# Patient Record
Sex: Female | Born: 1950 | Race: White | Hispanic: No | Smoking: Former smoker
Health system: Southern US, Community
[De-identification: ages and names within clinical notes are randomized; demographics above are authoritative.]

## PROBLEM LIST (undated history)

## (undated) DIAGNOSIS — J45909 Unspecified asthma, uncomplicated: Secondary | ICD-10-CM

## (undated) DIAGNOSIS — I82409 Acute embolism and thrombosis of unspecified deep veins of unspecified lower extremity: Secondary | ICD-10-CM

## (undated) DIAGNOSIS — G4730 Sleep apnea, unspecified: Secondary | ICD-10-CM

## (undated) DIAGNOSIS — I10 Essential (primary) hypertension: Secondary | ICD-10-CM

## (undated) DIAGNOSIS — E119 Type 2 diabetes mellitus without complications: Secondary | ICD-10-CM

## (undated) DIAGNOSIS — K219 Gastro-esophageal reflux disease without esophagitis: Secondary | ICD-10-CM

## (undated) DIAGNOSIS — D126 Benign neoplasm of colon, unspecified: Secondary | ICD-10-CM

## (undated) DIAGNOSIS — I341 Nonrheumatic mitral (valve) prolapse: Secondary | ICD-10-CM

## (undated) DIAGNOSIS — M1990 Unspecified osteoarthritis, unspecified site: Secondary | ICD-10-CM

## (undated) DIAGNOSIS — I739 Peripheral vascular disease, unspecified: Secondary | ICD-10-CM

## (undated) DIAGNOSIS — M5020 Other cervical disc displacement, unspecified cervical region: Secondary | ICD-10-CM

## (undated) DIAGNOSIS — R233 Spontaneous ecchymoses: Secondary | ICD-10-CM

## (undated) DIAGNOSIS — R238 Other skin changes: Secondary | ICD-10-CM

## (undated) HISTORY — PX: HERNIA REPAIR: SHX51

## (undated) HISTORY — DX: Essential (primary) hypertension: I10

## (undated) HISTORY — PX: CHOLECYSTECTOMY: SHX55

## (undated) HISTORY — DX: Type 2 diabetes mellitus without complications: E11.9

## (undated) HISTORY — PX: CERVICAL SPINE SURGERY: SHX589

## (undated) HISTORY — DX: Unspecified asthma, uncomplicated: J45.909

## (undated) HISTORY — PX: APPENDECTOMY: SHX54

## (undated) HISTORY — PX: TONSILLECTOMY: SUR1361

---

## 2004-09-30 ENCOUNTER — Ambulatory Visit: Payer: Self-pay | Admitting: Internal Medicine

## 2005-02-06 ENCOUNTER — Ambulatory Visit: Payer: Self-pay | Admitting: Internal Medicine

## 2005-09-06 HISTORY — PX: ABDOMINAL HYSTERECTOMY: SHX81

## 2006-01-03 ENCOUNTER — Ambulatory Visit: Payer: Self-pay | Admitting: Internal Medicine

## 2006-01-05 ENCOUNTER — Inpatient Hospital Stay (HOSPITAL_COMMUNITY): Admission: AD | Admit: 2006-01-05 | Discharge: 2006-01-05 | Payer: Self-pay | Admitting: Gynecology

## 2006-01-05 ENCOUNTER — Ambulatory Visit: Payer: Self-pay | Admitting: Gynecology

## 2006-01-13 ENCOUNTER — Encounter (INDEPENDENT_AMBULATORY_CARE_PROVIDER_SITE_OTHER): Payer: Self-pay | Admitting: Specialist

## 2006-01-13 ENCOUNTER — Ambulatory Visit (HOSPITAL_COMMUNITY): Admission: RE | Admit: 2006-01-13 | Discharge: 2006-01-14 | Payer: Self-pay | Admitting: Gynecology

## 2006-01-13 ENCOUNTER — Ambulatory Visit: Payer: Self-pay | Admitting: Gynecology

## 2006-01-27 ENCOUNTER — Ambulatory Visit: Payer: Self-pay | Admitting: Internal Medicine

## 2006-02-07 ENCOUNTER — Ambulatory Visit: Payer: Self-pay | Admitting: Gynecology

## 2006-03-17 ENCOUNTER — Other Ambulatory Visit: Payer: Self-pay

## 2006-03-17 ENCOUNTER — Emergency Department: Payer: Self-pay | Admitting: Emergency Medicine

## 2007-01-31 ENCOUNTER — Ambulatory Visit: Payer: Self-pay | Admitting: Internal Medicine

## 2007-12-08 ENCOUNTER — Ambulatory Visit: Payer: Self-pay | Admitting: Specialist

## 2007-12-25 ENCOUNTER — Ambulatory Visit: Payer: Self-pay | Admitting: Gastroenterology

## 2007-12-29 ENCOUNTER — Emergency Department: Payer: Self-pay | Admitting: Emergency Medicine

## 2008-01-02 ENCOUNTER — Emergency Department: Payer: Self-pay | Admitting: Emergency Medicine

## 2008-02-06 ENCOUNTER — Ambulatory Visit: Payer: Self-pay | Admitting: Internal Medicine

## 2008-09-06 HISTORY — PX: SPINE SURGERY: SHX786

## 2008-09-13 ENCOUNTER — Ambulatory Visit: Payer: Self-pay | Admitting: Orthopedic Surgery

## 2008-10-10 ENCOUNTER — Ambulatory Visit: Payer: Self-pay | Admitting: Internal Medicine

## 2009-01-20 ENCOUNTER — Ambulatory Visit: Payer: Self-pay | Admitting: Otolaryngology

## 2009-02-10 ENCOUNTER — Ambulatory Visit: Payer: Self-pay | Admitting: Internal Medicine

## 2009-03-12 ENCOUNTER — Ambulatory Visit (HOSPITAL_COMMUNITY): Admission: RE | Admit: 2009-03-12 | Discharge: 2009-03-13 | Payer: Self-pay | Admitting: Neurosurgery

## 2009-07-25 ENCOUNTER — Emergency Department: Payer: Self-pay | Admitting: Emergency Medicine

## 2009-09-06 HISTORY — PX: COLOSTOMY: SHX63

## 2009-12-27 ENCOUNTER — Encounter: Admission: RE | Admit: 2009-12-27 | Discharge: 2009-12-27 | Payer: Self-pay | Admitting: Neurosurgery

## 2010-01-07 ENCOUNTER — Ambulatory Visit: Payer: Self-pay | Admitting: Otolaryngology

## 2010-02-11 ENCOUNTER — Ambulatory Visit: Payer: Self-pay | Admitting: Internal Medicine

## 2010-03-02 ENCOUNTER — Ambulatory Visit: Payer: Self-pay | Admitting: Internal Medicine

## 2010-03-11 ENCOUNTER — Inpatient Hospital Stay: Payer: Self-pay | Admitting: Specialist

## 2010-03-27 ENCOUNTER — Inpatient Hospital Stay: Payer: Self-pay | Admitting: Surgery

## 2010-04-19 ENCOUNTER — Emergency Department: Payer: Self-pay | Admitting: Emergency Medicine

## 2010-04-28 ENCOUNTER — Ambulatory Visit: Payer: Self-pay | Admitting: Gastroenterology

## 2010-04-30 LAB — PATHOLOGY REPORT

## 2010-05-04 ENCOUNTER — Emergency Department: Payer: Self-pay | Admitting: Emergency Medicine

## 2010-05-12 ENCOUNTER — Emergency Department: Payer: Self-pay | Admitting: Emergency Medicine

## 2010-05-18 ENCOUNTER — Ambulatory Visit: Payer: Self-pay | Admitting: Surgery

## 2010-06-23 ENCOUNTER — Ambulatory Visit: Payer: Self-pay | Admitting: Surgery

## 2010-06-30 ENCOUNTER — Inpatient Hospital Stay: Payer: Self-pay | Admitting: Surgery

## 2010-06-30 HISTORY — PX: COLON SURGERY: SHX602

## 2010-07-02 LAB — PATHOLOGY REPORT

## 2010-07-15 ENCOUNTER — Ambulatory Visit: Payer: Self-pay | Admitting: Surgery

## 2010-07-21 ENCOUNTER — Inpatient Hospital Stay: Payer: Self-pay | Admitting: Surgery

## 2010-08-13 ENCOUNTER — Ambulatory Visit: Payer: Self-pay | Admitting: Surgery

## 2010-09-28 ENCOUNTER — Encounter: Payer: Self-pay | Admitting: Neurosurgery

## 2010-11-13 ENCOUNTER — Ambulatory Visit: Payer: Self-pay | Admitting: Surgery

## 2010-12-13 LAB — BASIC METABOLIC PANEL
BUN: 14 mg/dL (ref 6–23)
CO2: 30 mEq/L (ref 19–32)
Calcium: 9.4 mg/dL (ref 8.4–10.5)
Chloride: 104 mEq/L (ref 96–112)
Creatinine, Ser: 1.11 mg/dL (ref 0.4–1.2)
GFR calc Af Amer: >60 mL/min (ref >60)
GFR calc non Af Amer: 50 mL/min — ABNORMAL LOW (ref >60)
Glucose, Bld: 106 mg/dL — ABNORMAL HIGH (ref 70–99)
Potassium: 4.6 mEq/L (ref 3.5–5.1)
Sodium: 140 mEq/L (ref 135–145)

## 2010-12-13 LAB — CBC
HCT: 42.7 % (ref 36.0–46.0)
Hemoglobin: 14.7 g/dL (ref 12.0–15.0)
MCHC: 34.5 g/dL (ref 30.0–36.0)
MCV: 94.6 fL (ref 78.0–100.0)
Platelets: 219 10*3/uL (ref 150–400)
RBC: 4.51 MIL/uL (ref 3.87–5.11)
RDW: 12.9 % (ref 11.5–15.5)
WBC: 6 10*3/uL (ref 4.0–10.5)

## 2011-01-19 NOTE — Op Note (Signed)
NAMEAMAIYA, Kelly Perry    ACCOUNT NO.:  [id number]   MEDICAL RECORD NO.:  [id number]          PATIENT TYPE:  AMB   LOCATION:  SDS                          FACILITY:  [facility]   PHYSICIAN:  Hewitt Shorts, M.D.DATE OF BIRTH:  1951-03-18   DATE OF PROCEDURE:  03/12/2009  DATE OF DISCHARGE:                               OPERATIVE REPORT   PREOPERATIVE DIAGNOSES:  1. C5-6 cervical disk herniation.  2. Cervical degenerative disk disease.  3. Cervical spondylosis.  4. Neck pain.   POSTOPERATIVE DIAGNOSES:  1. C5-6 cervical disk herniation.  2. Cervical degenerative disk disease.  3. Cervical spondylosis.  4. Neck pain.   PROCEDURE:  C5-6 anterior cervical decompression arthrodesis with  allograft and Tether cervical plating.   SURGEON:  Hewitt Shorts, M.D.   ASSISTANT:  Clydene Fake, M.D.   ANESTHESIA:  General endotracheal.   INDICATIONS:  The patient is a 60 year old woman who presented with neck  pain, extending into the shoulders and upper extremities.  MRI scan  revealed significant degenerative disk at C5-6 broad-based disk  protrusion.  Decision was made to proceed with a single level  decompression and arthrodesis.   PROCEDURE:  The patient was brought to the operating room and placed  under general endotracheal anesthesia.  The patient was placed in 10  pounds of Holter traction.  The neck was prepped with Betadine soap and  solution, draped in a sterile fashion.  A horizontal incision was made  on the left side of the neck.  The line of the incision was infiltrated  with local anesthetic with epinephrine.  Dissection was carried down  through the subcutaneous tissue and platysma.  Bipolar cautery was used  to maintain hemostasis.  Dissection was then carried through an  avascular plane leaving the sternocleidomastoid, carotid artery, and  jugular vein laterally, and the trachea and esophagus medially.  The  ventral aspect of the vertebral column  was identified and localizing x-  ray was taken and the C5-6 intervertebral disk space was identified.  Diskectomy was begun with an incision of the annulus and continued with  microcurettes and pituitary rongeurs.  The operating microscope was  draped and brought into the field to provide additional navigation,  illumination, and visualization.  The remainder of the decompression  performed using microdissection and microsurgical technique.  Anterior  osteophytic overgrowth was removed using the osteophyte removal tool and  the cartilaginous endplates were removed using microcurettes along with  the high-speed drill.  Posterior osteophytic overgrowth was similarly  removed with the high-speed drill along with a 2-mm Kerrison punch with  a thin footplate.  Posterior longitudinal ligament was carefully opened  and ligamented disk herniation were removed.  We were able to decompress  the spinal canal and thecal sac, and examined the neuroforamen, and  removed the spondylitic disk protrusion laterally and neuroforamen were  decompressed.   Once the decompression was completed, hemostasis was established with  the use of Gelfoam soaked in thrombin, and then Gelfoam was removed.  Hemostasis was confirmed.  We measured the height of the intervertebral  disk space and selected an 8-mm allograft implant.  The allograft  implant was hydrated in saline solution and then carefully positioned in  the intervertebral disk space and countersunk.  We then selected a 14-mm  tethered cervical plate, it was positioned over the fusion construct and  secured to the vertebra with 4 x 14 mm variable angle screws placing a  pair of screws at C5 and another pair at C6.  Each screw hole was  drilled and the screws placed in alternating fashion.  Once all 4 screws  were placed, the final tightening was performed.  An x-ray was taken  which showed the graft, plate, and screws in good position.  The  alignment was  good.  The wound was irrigated with Bacitracin solution.  Hemostasis was established and confirmed, and then we proceeded with  closure.  Platysma was closed with interrupted inverted 2-0 undyed  Vicryl sutures.  The subcutaneous and subcuticular layer closed with  interrupted inverted 3-0 undyed Vicryl sutures.  The skin was  reapproximated with Dermabond.  The procedure was tolerated well.  Estimated blood loss was 50 mL.  Sponge and needle count were correct.  Following surgery, the patient was taken out of the cervical traction,  was placed in a soft cervical collar, reversed from the anesthetic,  extubated, to be transferred to the recovery room for further care.      Hewitt Shorts, M.D.  Electronically Signed     RWN/MEDQ  D:  03/12/2009  T:  03/13/2009  Job:  [id number]

## 2011-01-22 NOTE — Op Note (Signed)
Kelly Perry, Kelly Perry            ACCOUNT NO.:  [id number]   MEDICAL RECORD NO.:  [id number]           PATIENT TYPE:   LOCATION:                                 FACILITY:   PHYSICIAN:  Ginger Carne, MD       DATE OF BIRTH:   DATE OF PROCEDURE:  01/13/2006  DATE OF DISCHARGE:                                 OPERATIVE REPORT   PREOPERATIVE DIAGNOSIS:  Bilateral ovarian masses.   POSTOP DIAGNOSIS:  1.  Bilateral ovarian masses.  2.  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   PROCEDURES:  1.  Laparoscopic-assisted vaginal hysterectomy.  2.  Bilateral salpingo-oophorectomy.  3.  Appendectomy.  4.  Peritoneal washings.   SURGEON:  Ginger Carne, MD   ASSISTANT:  None.   COMPLICATIONS:  None immediate.   ESTIMATED BLOOD LOSS:  75 mL.   SPECIMEN:  Uterus, cervix, right left tubes and ovaries, appendix, and  peritoneal washings.   ANESTHESIA:  General.   OPERATIVE FINDINGS:  External genitalia, vulva, vagina normal.  Cervix  smooth without erosions or lesions.  The laparoscopic evaluation revealed  bilateral ovarian masses that appeared cystic, 6 cm on the left side and 5  on the right.  The left was adherent to the anterior abdominal wall adjacent  to the bladder.  The ovaries demonstrated no evidence for excrescences.  Tubes appeared normal.  No evidence of peritoneal implants or adhesive  disease.  Large and small bowel grossly normal.  Appendix appeared normal.  The uterus appeared to have a normal contour and was normal size.   OPERATIVE PROCEDURE:  The patient prepped and draped in the usual fashion  and placed in the lithotomy position.  Betadine solution used for antiseptic  and the patient was catheterized prior to procedure.  After adequate general  anesthesia, tenaculum was placed on the anterior lip of the cervix and the  [facility] uterine manipulator placed in the same.  Afterwards a vertical  infraumbilical incision was made; and the Veress needle  placed in the  abdomen.  Opening closing pressures were 10-15 mmHg. Verres released and the  trocar placed in the same incision and the laparoscope placed in the trocar  sleeve.  Two 5-mm ports were made under direct visualization in the left  lower quadrant and left hypogastric regions respectively.  After inspecting  the pelvis and upper abdomen and evaluating the appendix, liver, and all  other abdominal structures, which appeared to be normal; the mesoappendix  was identified, bipolar cauterized; and cut to the base; #0 Vicryl sutures  and a loop tie were placed at the base x2, one about 8 mm above the first  two.  The appendix was cut above the first 2 sutures and removed with an  Endopouch bag.  Copious irrigation with lactated Ringer's followed for the  base.  No active bleeding noted.  Following this the hysterectomy proper was  performed.   The left ovarian adhesion to the parietal peritoneum on the left anterior  abdominal wall, immediately above the bladder, was identified; adhesions  were cut carefully which allowed the ovary to  drop into the pelvis.  The  ureters identified bilaterally.  Both infundibulopelvic ligaments were  skeletonized, bipolar cauterized, and cut to the round ligaments which were  similarly bipolar cauterized and cut.  No active bleeding noted.   At this point attention was directed to the vaginal portion of the  procedure.  After repositioning and adequate draping, a double-tooth  tenaculum placed on the anterior and posterior lips of the cervix.  Marcaine  with epinephrine solution was used as a paracervical block.  Anterior and  posterior vaginal epithelium was incised transversely by cautery.  The  peritoneal reflection similarly opened without injury to the respective  organs.  Uterosacral-cardinal ligament complexes were clamped, cut, and  ligated with #0 Vicryl suture and affixed to their respective vaginal walls.  The uterine vasculature was  clamped, cut, and ligated with #0 Vicryl suture;  ascending branches and broad ligaments similarly clamped, cut, and ligated  with #0 Vicryl suture.  At this point, the uterus, cervix, and both tubes  and ovaries were removed.  Bleeding points hemostatically checked.  Blood  clots removed.  Closure of the cuff in one layer was #0 Vicryl, running,  interlocking suture.   The patient was reexamined through laparoscope and bleeding points  hemostatically checked.  Irrigation of lactated Ringer's followed; irrigant  removed.  Gas released, trocars removed.  Closure of the 10-mm fascia site  with #0 Vicryl suture, and 4-0 Vicryl for a subcuticular closure.  Instrument and sponge count were correct.  The patient tolerated the  procedure well; and returned to post anesthesia recovery room in excellent  condition.   Intraoperative frozen section report was obtained revealing bilateral serous  cystadenomas of both ovaries.      Ginger Carne, MD  Electronically Signed     SHB/MEDQ  D:  01/13/2006  T:  01/14/2006  Job:  [id number]

## 2011-01-22 NOTE — H&P (Signed)
Kelly Perry, Kelly Perry            ACCOUNT NO.:  [id number]   MEDICAL RECORD NO.:  [id number]          PATIENT TYPE:  AMB   LOCATION:  SDC                           FACILITY:  [facility]   PHYSICIAN:  Ginger Carne, MD  DATE OF BIRTH:  March 13, 1951   DATE OF ADMISSION:  01/13/2006  DATE OF DISCHARGE:                                HISTORY & PHYSICAL   CHIEF COMPLAINT:  Bilateral ovarian multicystic masses.   HISTORY OF PRESENT ILLNESS:  This patient is a 60 year old gravida 5, para 73-  0-2-3 Caucasian female who developed the onset of bilateral pelvic pain  approximately 1 month ago.  She was seen at her local emergency room, at  which time CT scan with dye was performed.  At that time, she was told that  she had 2 ovarian cysts and was followed up by her primary care physician in  [facility].  An ultrasound scan revealed on both ovaries, simple cystic  structures without preperitoneal fluid.  She was seen by her gynecologist in  [facility] and was scheduled for surgery.  The patient came to this  office for a second opinion and for surgery.  CA125 performed through  [facility] reveals a level of 16.  Repeat ultrasound scan at the [facility] [place] on Jan 05, 2006 demonstrates bilateral ovarian cyst  masses.  These are multicystic with then septae, no intracavitary  excrescences and no evidence of Doppler abnormalities.  Both masses measure  6 cm in total dimension.  There is no free fluid.  Uterus appears to be  normal in contour and endometrial stripe is 4 mm.  The patient is menopausal  since the age of 36.  She has had no postmenopausal bleeding.   OBSTETRICAL/GYNECOLOGICAL HISTORY:  The patient has had 3 full-term vaginal  deliveries and 2 first trimester spontaneous miscarriages.   ALLERGIES:  PENICILLIN, SULFA, ASPIRIN.  None to Latex or iodine or food.  The patient prefers paper tape.  The patient also indicates that LIDOCAINE  had caused her at one time to  stop breathing, but Carbocaine is acceptable  and has had no untoward complications in the past.   CURRENT MEDICATIONS:  1.  Prilosec one twice a day, 20 mg.  2.  Atenolol 25 mg twice a day.  3.  Cozaar one in the morning daily.  4.  Triamterene one daily.  5.  K-Dur two daily.  6.  Singulair 10 mg one at night daily.  7.  Donnatal Extentabs one twice a day as needed for irritable bowel      syndrome.  8.  Calcium and vitamin D.   MEDICAL HISTORY:  1.  The patient has mitral valve prolapse and requires preoperative      antibiotic prophylaxis.  2.  She has hypertension.  3.  GERD.  4.  The patient reports also a history of irritable bowel syndrome.   SOCIAL HISTORY:  The patient is divorced, denies alcohol, drug abuse and  does not smoke.   SOCIAL HISTORY:  No first-degree relatives with breast, colon or uterine  carcinoma.   REVIEW  OF SYSTEMS:  Ten-point comprehensive review of systems is  unremarkable.   PHYSICAL EXAMINATION:  GENERAL:  Pleasant female in no acute distress.  VITAL SIGNS:  Blood pressure is 138/74.  Height and weight per the vital  sign record.  HEENT:  Grossly normal.  BREASTS:  Without masses, discharge, thickenings or tenderness bilaterally.  CHEST:  Clear to percussion and auscultation.  CARDIOVASCULAR:  Without murmurs or enlargement.  Regular rate and rhythm.  EXTREMITIES:  Within normal limits.  LYMPHATICS:  Within normal limits.  NEUROLOGICAL:  Within normal limits.  MUSCULOSKELETAL:  Within normal limits.  VASCULAR:  Within normal limits.  ABDOMEN:  Soft without gross hepatosplenomegaly.  PELVIC:  External genitalia, vulva and vagina normal.  Cervix smooth without  erosions or lesions.  Uterus is normal in size.  Both adnexa reveal  fullness, consistent with ultrasound findings.  The patient's exam is  limited by BMI.  RECTAL:  Hemoccult-negative without masses.   LABORATORY AND ACCESSORY CLINICAL DATA:  Ultrasound per aforementioned   description.   CA125 16.   IMPRESSION:  Bilateral ovarian masses with normal CA125 and pelvic pain.   PLAN:  The patient is postmenopausal and with said masses, is scheduled for  a laparoscopically assisted vaginal hysterectomy, bilateral salpingo-  oophorectomy, appendectomy with possible conversion to an open procedure in  addition to obtaining peritoneal washings.  The patient understands that  there are no assurances that said masses could not be carcinoma or  borderline tumors; in the event that there is a possibility on inspection of  said findings, the patient will be converted to an open procedure.  Ashby Dawes  of said procedure discussed in detail.  Risks including possible injury to  ureter, bowel and bladder, possible conversion to an open procedure,  hemorrhage requiring blood transfusion, infection, pulmonary or  gastrointestinal complications and other unforeseen complications discussed  with said patient.      Ginger Carne, MD  Electronically Signed     SHB/MEDQ  D:  01/11/2006  T:  01/11/2006  Job:  [id number]

## 2011-01-22 NOTE — Discharge Summary (Signed)
Kelly Perry, BROADWELL            ACCOUNT NO.:  [id number]   MEDICAL RECORD NO.:  [id number]          PATIENT TYPE:  OIB   LOCATION:  9307                          FACILITY:  [facility]   PHYSICIAN:  Ginger Carne, MD  DATE OF BIRTH:  1951/08/24   DATE OF ADMISSION:  01/13/2006  DATE OF DISCHARGE:  01/14/2006                                 DISCHARGE SUMMARY   REASON FOR HOSPITALIZATION:  Bilateral ovarian multicystic masses.   IN-HOSPITAL PROCEDURES:  Laparoscopic-assisted vaginal hysterectomy,  bilateral salpingo-oophorectomy, appendectomy and peritoneal washings.   FINAL DIAGNOSIS:  Bilateral serous cystadenoma on frozen section, final  pathology pending.   HOSPITAL COURSE:  This patient is a [name]  who underwent the aforementioned procedure on May10,2007, because of 6 cm  and 5 cm ovarian multicystic masses.  Interoperative frozen section revealed  benign tumors.  Pathology per above and final results pending.  Interoperative course was unremarkable.  Postoperatively, her course was  uneventful.  She was afebrile, voided well.  H&H 36.4 and 12.4 respectively.  Creatinine on discharge was 0.7.  Abdomen was soft.  Scant vaginal flow.  Lungs clear.  Calves without tenderness.  Description of surgery provided to  the patient and her fiance.   Discharge instructions provided to the patient including contacting the  office for temperature elevation above 100.4 degrees Fahrenheit, increasing  abdominal pain, incisional pain or drainage or redness, increased vaginal  bleeding or discharge, urinary tract or gastrointestinal symptomatology,  pulmonary symptomatology and/or any other concerns.  The patient was  provided Percocet 5/325 1-2 every 4-6h. p.r.n. pain, , Premarin 0.65 mg  daily.  She will be seen in the office in 3-4 weeks time for postoperative  visit.      Ginger Carne, MD  Electronically Signed     SHB/MEDQ  D:  01/14/2006  T:   01/14/2006  Job:  [id number]

## 2011-02-05 HISTORY — PX: COLOSTOMY TAKEDOWN: SHX5783

## 2011-02-12 ENCOUNTER — Ambulatory Visit: Payer: Self-pay | Admitting: Surgery

## 2011-02-12 DIAGNOSIS — I10 Essential (primary) hypertension: Secondary | ICD-10-CM

## 2011-02-19 ENCOUNTER — Inpatient Hospital Stay: Payer: Self-pay | Admitting: Surgery

## 2011-03-07 ENCOUNTER — Inpatient Hospital Stay: Payer: Self-pay | Admitting: Internal Medicine

## 2011-03-07 DIAGNOSIS — I739 Peripheral vascular disease, unspecified: Secondary | ICD-10-CM

## 2011-03-07 HISTORY — DX: Peripheral vascular disease, unspecified: I73.9

## 2011-03-16 ENCOUNTER — Other Ambulatory Visit: Payer: Self-pay | Admitting: Surgery

## 2011-03-19 ENCOUNTER — Other Ambulatory Visit: Payer: Self-pay | Admitting: Surgery

## 2011-03-25 ENCOUNTER — Other Ambulatory Visit: Payer: Self-pay | Admitting: Surgery

## 2011-04-01 ENCOUNTER — Other Ambulatory Visit: Payer: Self-pay | Admitting: Surgery

## 2011-04-08 ENCOUNTER — Other Ambulatory Visit: Payer: Self-pay | Admitting: Surgery

## 2011-04-08 ENCOUNTER — Other Ambulatory Visit: Payer: Self-pay

## 2011-04-21 ENCOUNTER — Other Ambulatory Visit: Payer: Self-pay | Admitting: Surgery

## 2011-04-27 ENCOUNTER — Ambulatory Visit: Payer: Self-pay | Admitting: Surgery

## 2011-05-04 ENCOUNTER — Other Ambulatory Visit: Payer: Self-pay | Admitting: Surgery

## 2011-05-12 ENCOUNTER — Other Ambulatory Visit: Payer: Self-pay | Admitting: Surgery

## 2011-05-14 ENCOUNTER — Ambulatory Visit: Payer: Self-pay | Admitting: Anesthesiology

## 2011-05-26 ENCOUNTER — Other Ambulatory Visit: Payer: Self-pay | Admitting: Surgery

## 2011-06-10 ENCOUNTER — Other Ambulatory Visit: Payer: Self-pay | Admitting: Surgery

## 2011-06-24 ENCOUNTER — Other Ambulatory Visit: Payer: Self-pay | Admitting: Surgery

## 2011-06-29 ENCOUNTER — Ambulatory Visit: Payer: Self-pay

## 2011-07-11 IMAGING — US US EXTREM LOW VENOUS BILAT
1 series · 17 of 24 positions shown · non-contrast
Comparison: none

REASON FOR EXAM: leg swelling, + d-dimer - eval for DVT
COMMENTS:

[Series 1: us extrem low venous bilat · 17 of 56 slices shown]
[im 1/56]
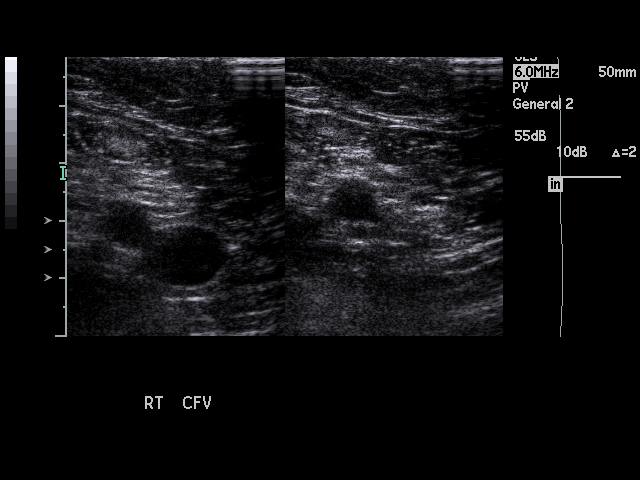
[im 5/56]
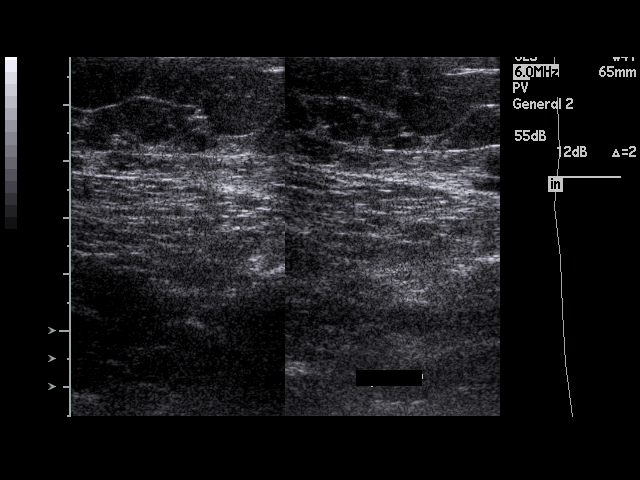
[im 8/56]
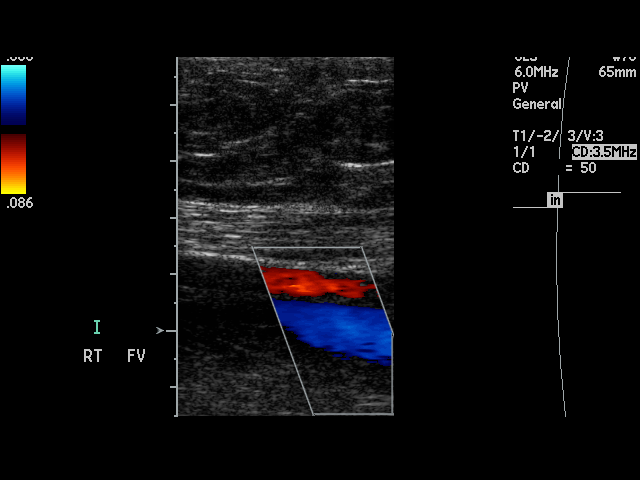
[im 10/56]
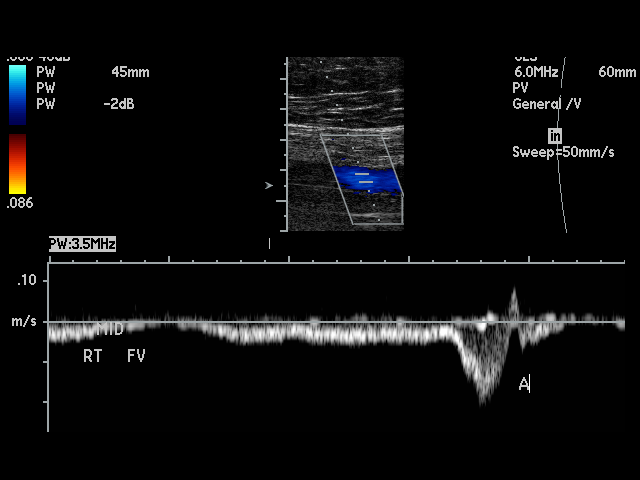
[im 15/56]
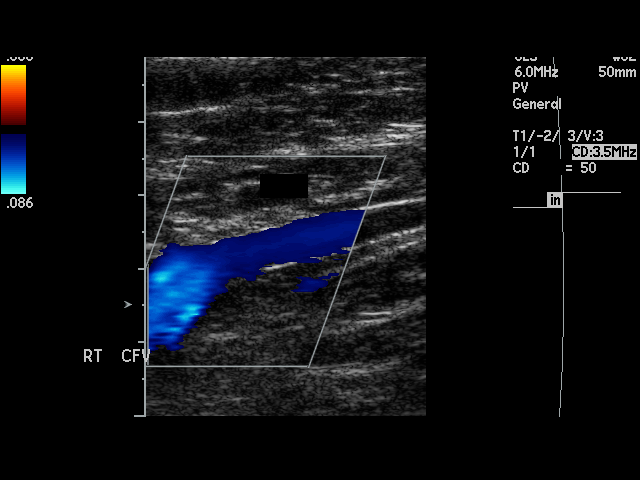
[im 17/56]
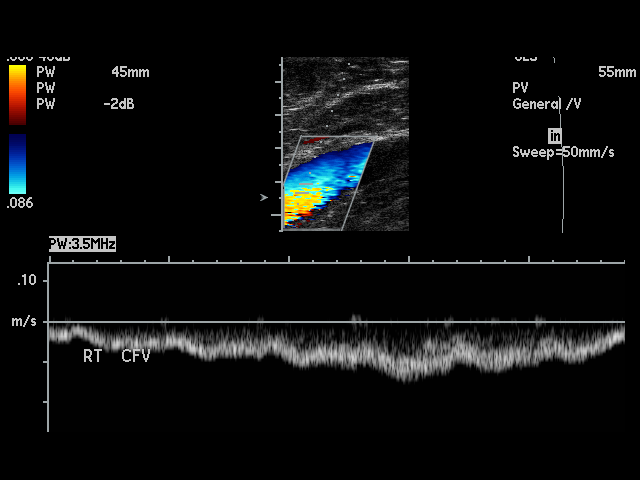
[im 22/56]
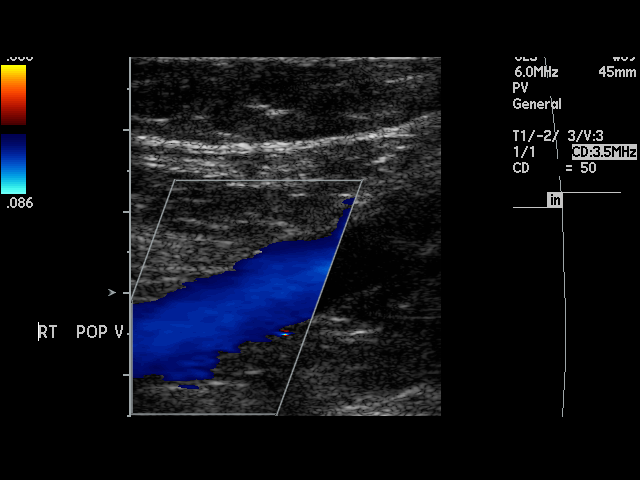
[im 24/56]
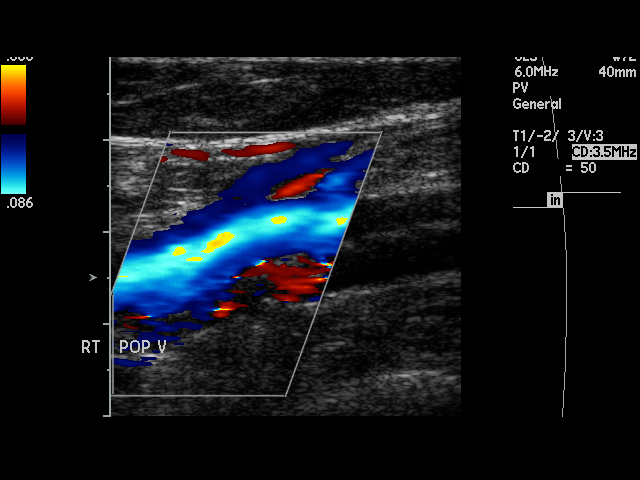
[im 29/56]
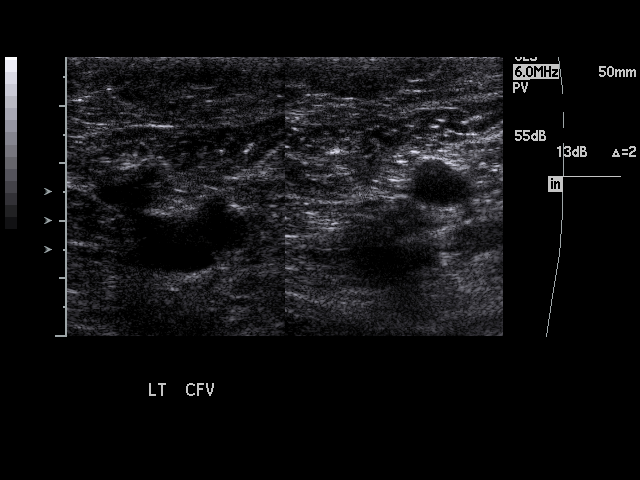
[im 32/56]
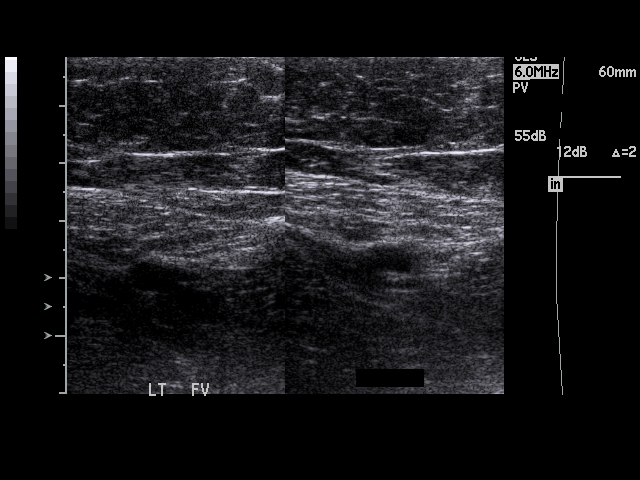
[im 34/56]
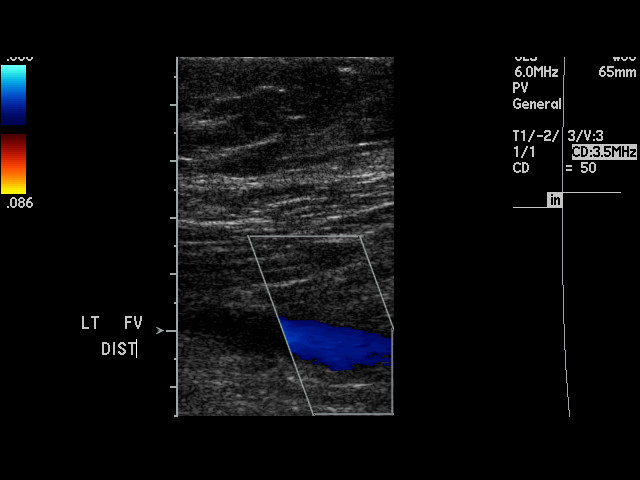
[im 39/56]
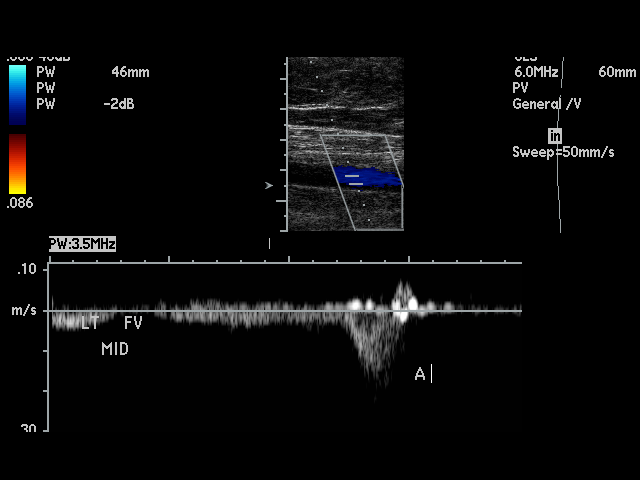
[im 41/56]
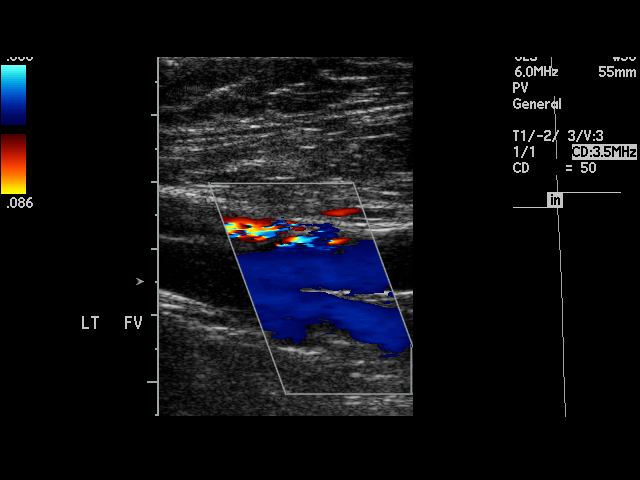
[im 46/56]
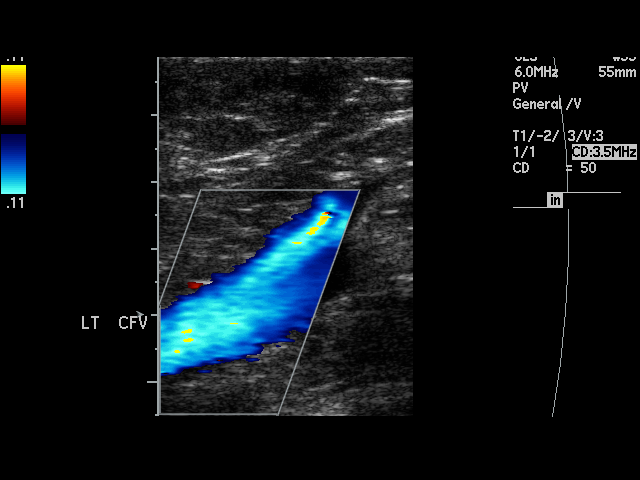
[im 48/56]
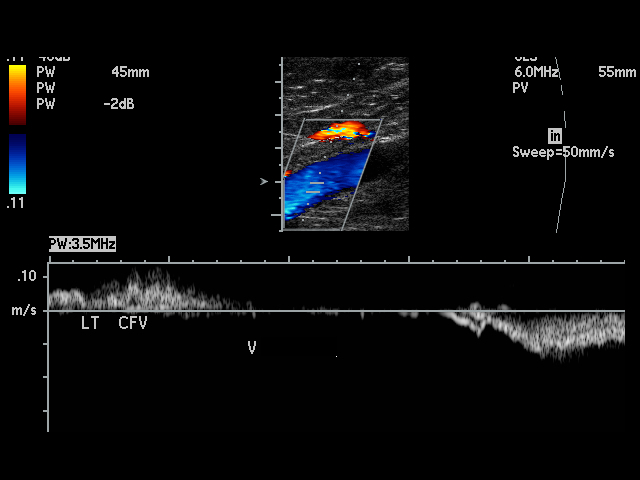
[im 51/56]
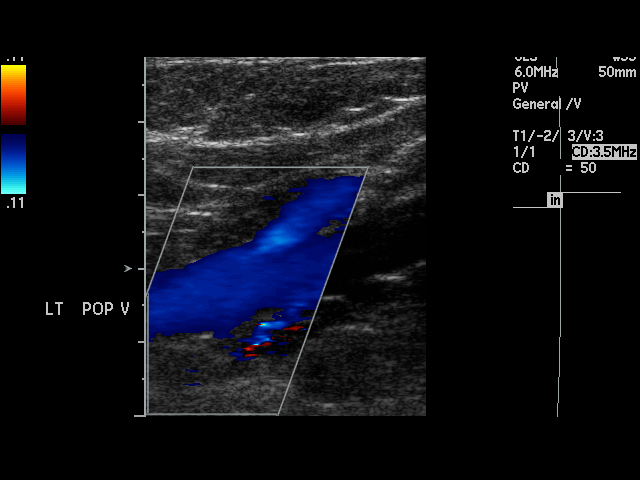
[im 56/56]
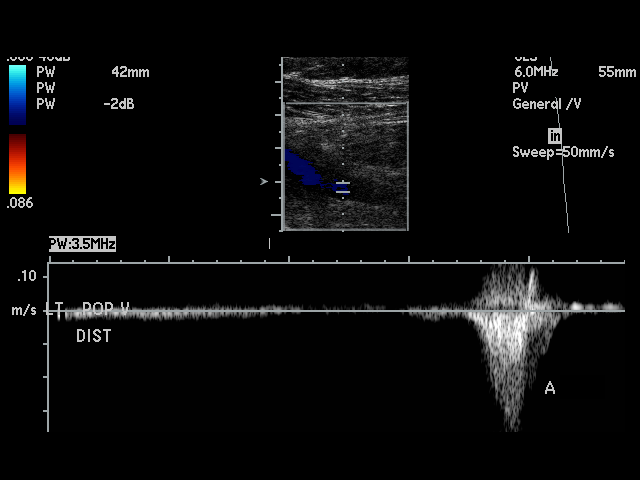

[17 of 24 positions shown; findings below may reference images not displayed]

PROCEDURE:     US  - US DOPPLER LOW EXTR BILATERAL  - July 25, 2009 [DATE]

RESULT:     Technique: Grayscale, duplex, color flow and SPECTRAL waveform
imaging was informed of the deep venous structures of the right and left
lower extremities.

There is no evidence of increased echogenicity, non-compressibility, nor
abnormal waveform or abnormal color flow within the interrogated deep venous
structures of the right and left lower extremities. There is appropriate
response to Valsalva and augmentation.
IMPRESSION: 1.  No sonographic evidence of a deep venous thrombus within the right or
left lower
     extremities.
2. Dr. Romina of the emergency department was informed of these findings
via a preliminary faxed report dated 07/25/2009 at [DATE] p.m.

## 2011-07-26 ENCOUNTER — Other Ambulatory Visit: Payer: Self-pay | Admitting: Surgery

## 2011-08-02 ENCOUNTER — Other Ambulatory Visit: Payer: Self-pay | Admitting: Surgery

## 2011-08-06 ENCOUNTER — Ambulatory Visit: Payer: Self-pay

## 2011-08-17 ENCOUNTER — Other Ambulatory Visit: Payer: Self-pay | Admitting: Surgery

## 2012-03-12 IMAGING — CT CT ABD-PELV W/ CM
1 of 2 series · 15 of 32 positions shown, 19 images · non-contrast
Comparison: none

REASON FOR EXAM: (1) LLQ PAIN; (2) LLQ PAIN
COMMENTS:

[Series 2: stone · axial · 0.90mm/px · z∈[+242,+683]mm · 15 of 162 slices shown, 19 images]
[im 8/162  soft-tissue]
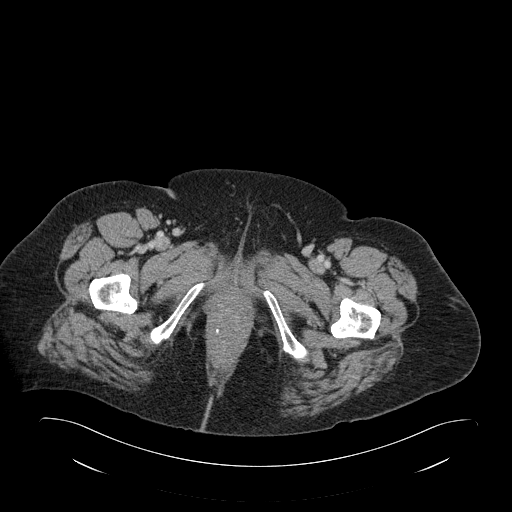
[im 8/162  bone]
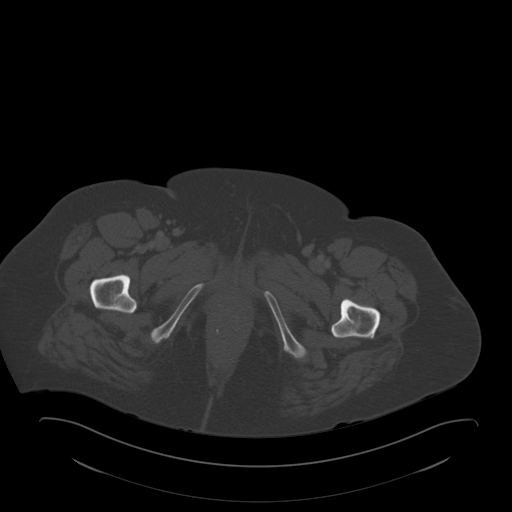
[im 22/162  soft-tissue]
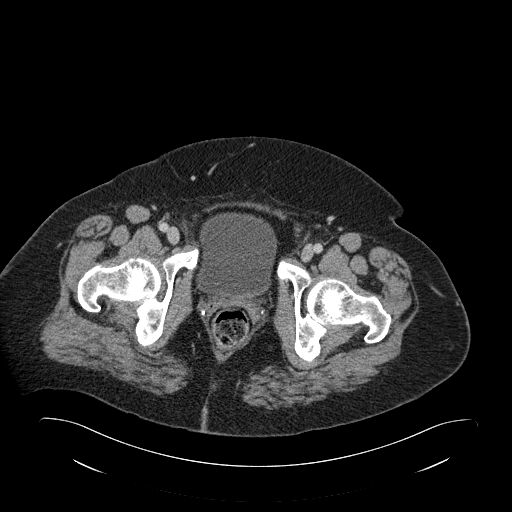
[im 36/162  soft-tissue]
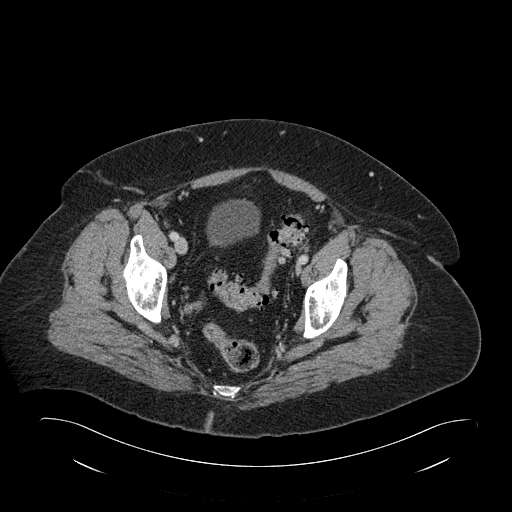
[im 43/162  soft-tissue]
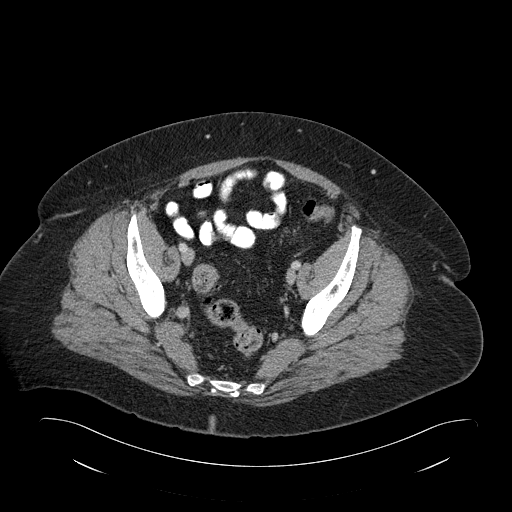
[im 57/162  soft-tissue]
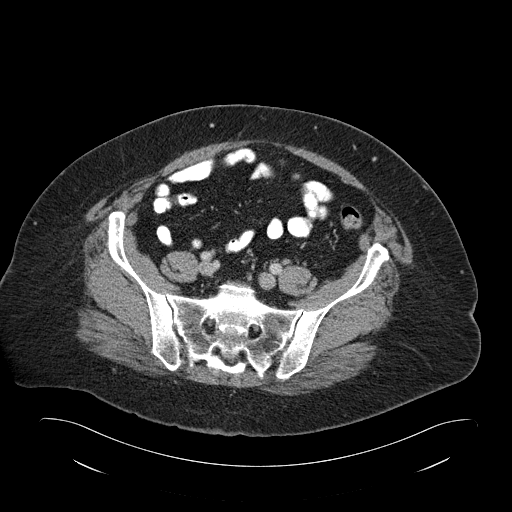
[im 71/162  soft-tissue]
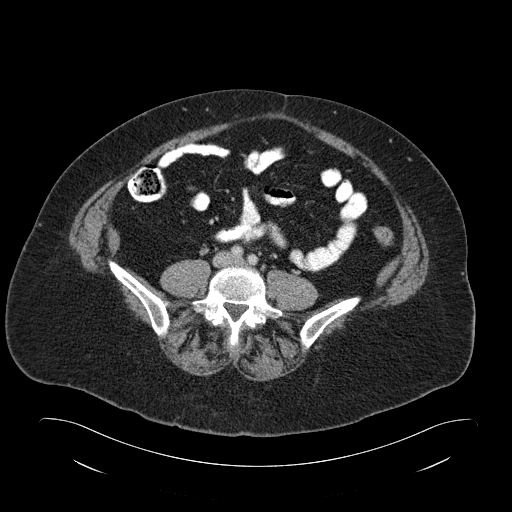
[im 85/162  soft-tissue]
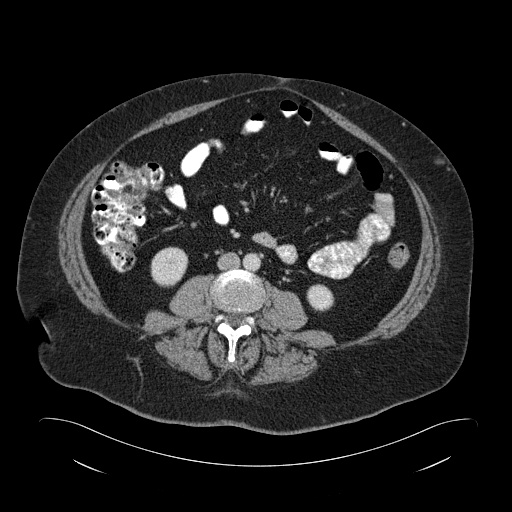
[im 92/162  soft-tissue]
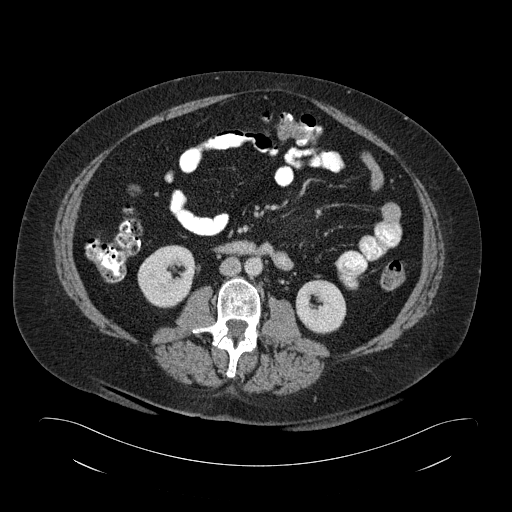
[im 106/162  soft-tissue]
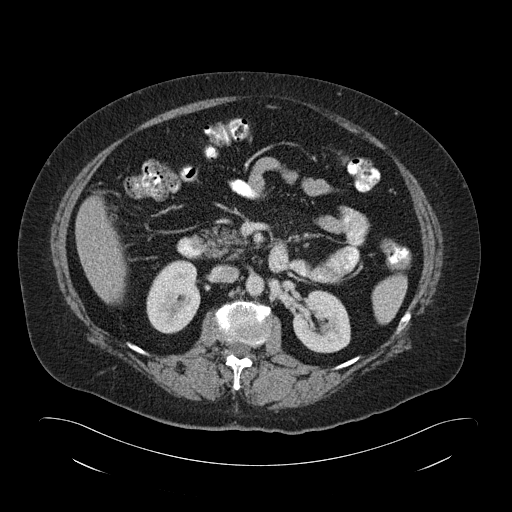
[im 106/162  bone]
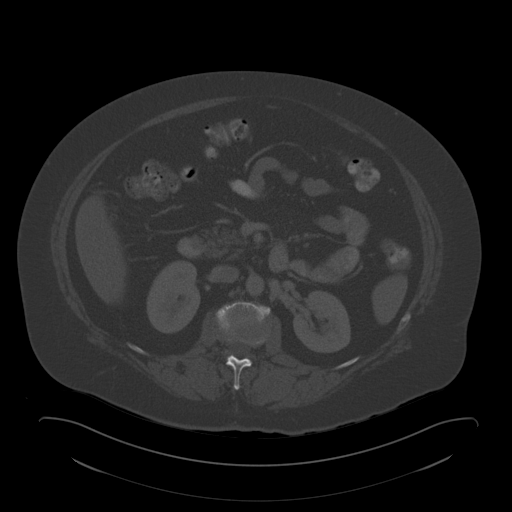
[im 120/162  soft-tissue]
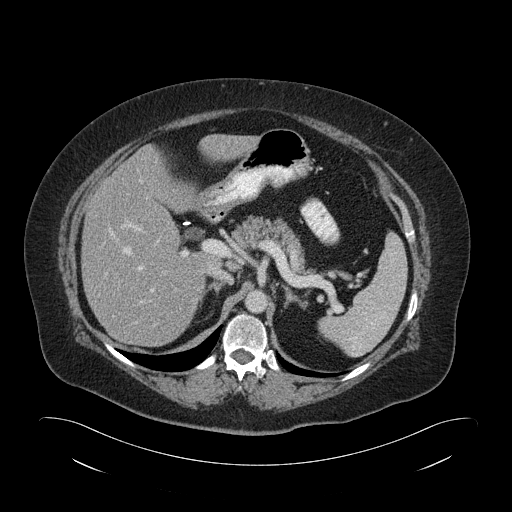
[im 127/162  soft-tissue]
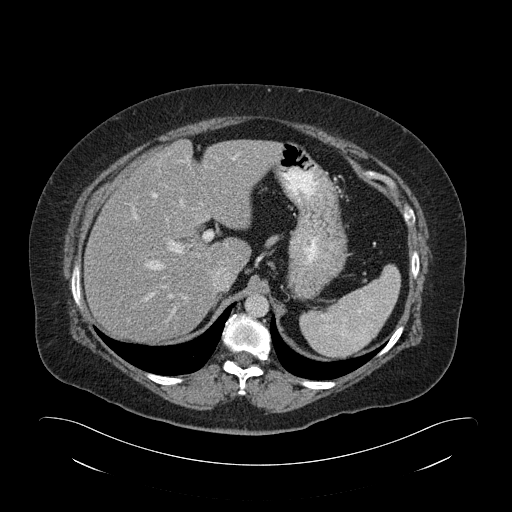
[im 134/162  lung]
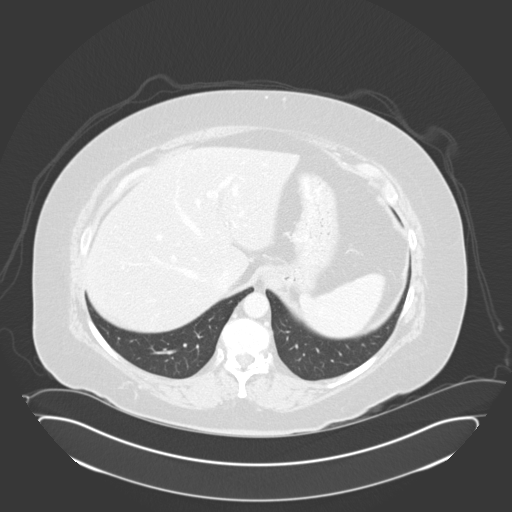
[im 141/162  soft-tissue]
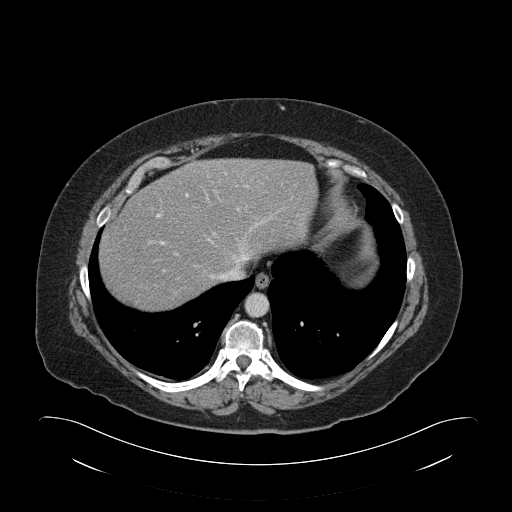
[im 141/162  lung]
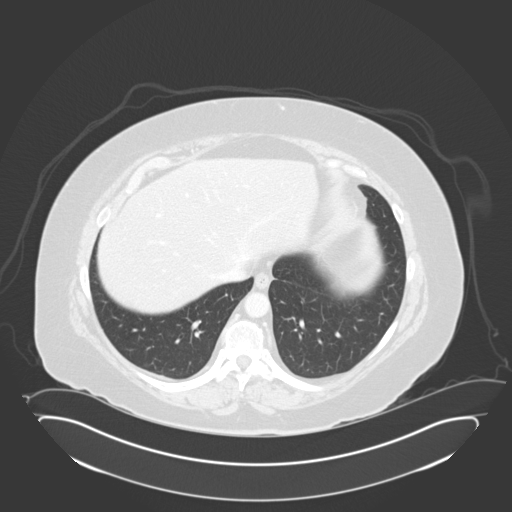
[im 148/162  lung]
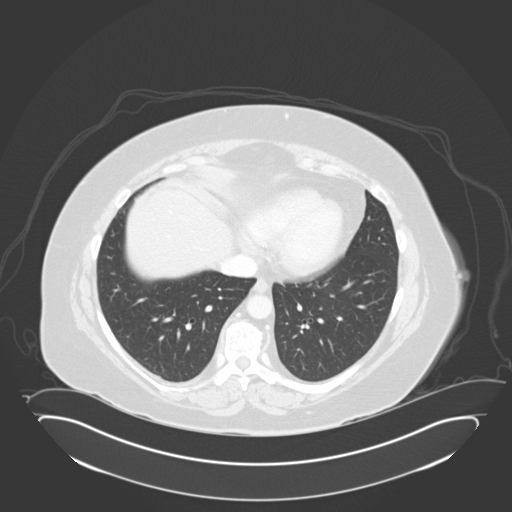
[im 155/162  soft-tissue]
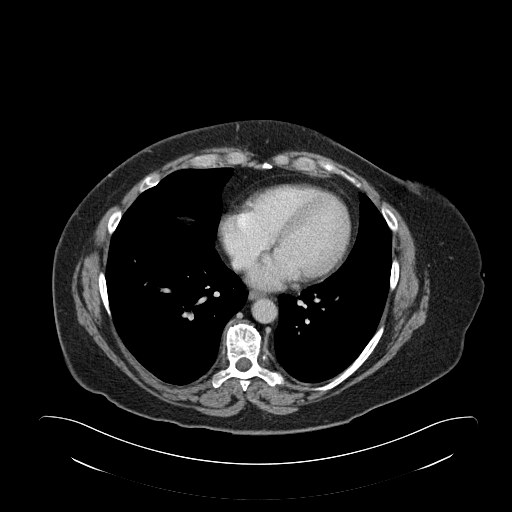
[im 155/162  lung]
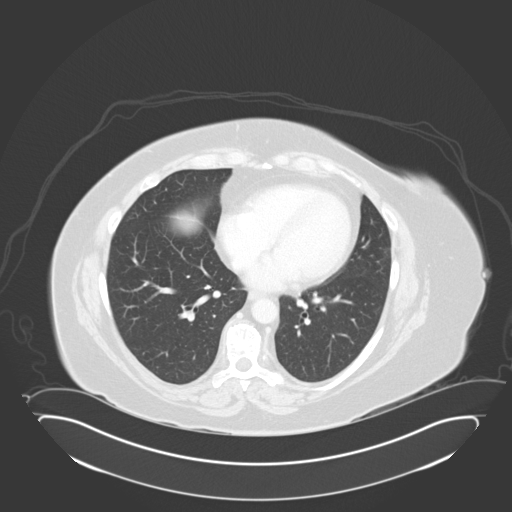

[15 of 32 positions shown; findings below may reference images not displayed]

PROCEDURE:     CT  - CT ABDOMEN / PELVIS  W  - March 27, 2010  [DATE]

RESULT:     Axial CT scanning was performed through the abdomen and pelvis
at 3 mm intervals and slice thicknesses following intravenous administration
of 100 cc of Jsovue-VSV as well as administration of oral contrast material.
Review of multiplanar reconstructed images was performed separately on the
VIA monitor. Comparison is made to the prior scan dated 02 March, 2010.

On the current study the orally administered contrast has reached only the
distal transverse colon. The stool and gas pattern within the colon does not
appear abnormal. There is sigmoid diverticulosis, but I do not see objective
evidence of acute diverticulitis. I see no increased density in the peri-
sigmoid fat. There is no evidence of an abscess or extraluminal gas. There
is no free fluid in the pelvis. The partially contrast-filled loops of small
bowel exhibit no acute abnormality. There is no significant inguinal or
umbilical hernia.

The liver, spleen, pancreas, stomach, adrenal glands, and kidneys are normal
in appearance. The caliber of the abdominal aorta is normal. The partially
distended urinary bladder is normal in appearance. The uterus is surgically
absent. I see no adnexal masses.

The lung bases are clear. The lumbar vertebral bodies are preserved in
height.
IMPRESSION: 1. I do not see definite evidence of acute diverticulitis. There is sigmoid
diverticulosis and incomplete distention of the sigmoid colon. No
perisigmoid inflammatory changes are seen. Certainly low-grade
diverticulitis can be present in the face of relatively few CT findings.
Overall the appearance of the sigmoid is not greatly changed from the prior
study.
2. I do not see acute abnormality of the bowel elsewhere.
3. There is no evidence of acute hepatobiliary abnormality nor acute urinary
tract abnormality. The gallbladder is surgically absent.

## 2012-06-08 IMAGING — CR DG CHEST 2V
1 series · 2 of 2 positions shown · non-contrast
Comparison: none

REASON FOR EXAM: [DATE]---HTN
COMMENTS:

PROCEDURE:     DXR - DXR CHEST PA (OR AP) AND LATERAL  - June 23, 2010  [DATE]
RESULT:     The lungs are clear. The cardiac silhouette and visualized bony
skeleton are unremarkable.

[Series 1: view not recorded · 0.17mm/px · 2 of 2 slices shown]
[im 1/2]
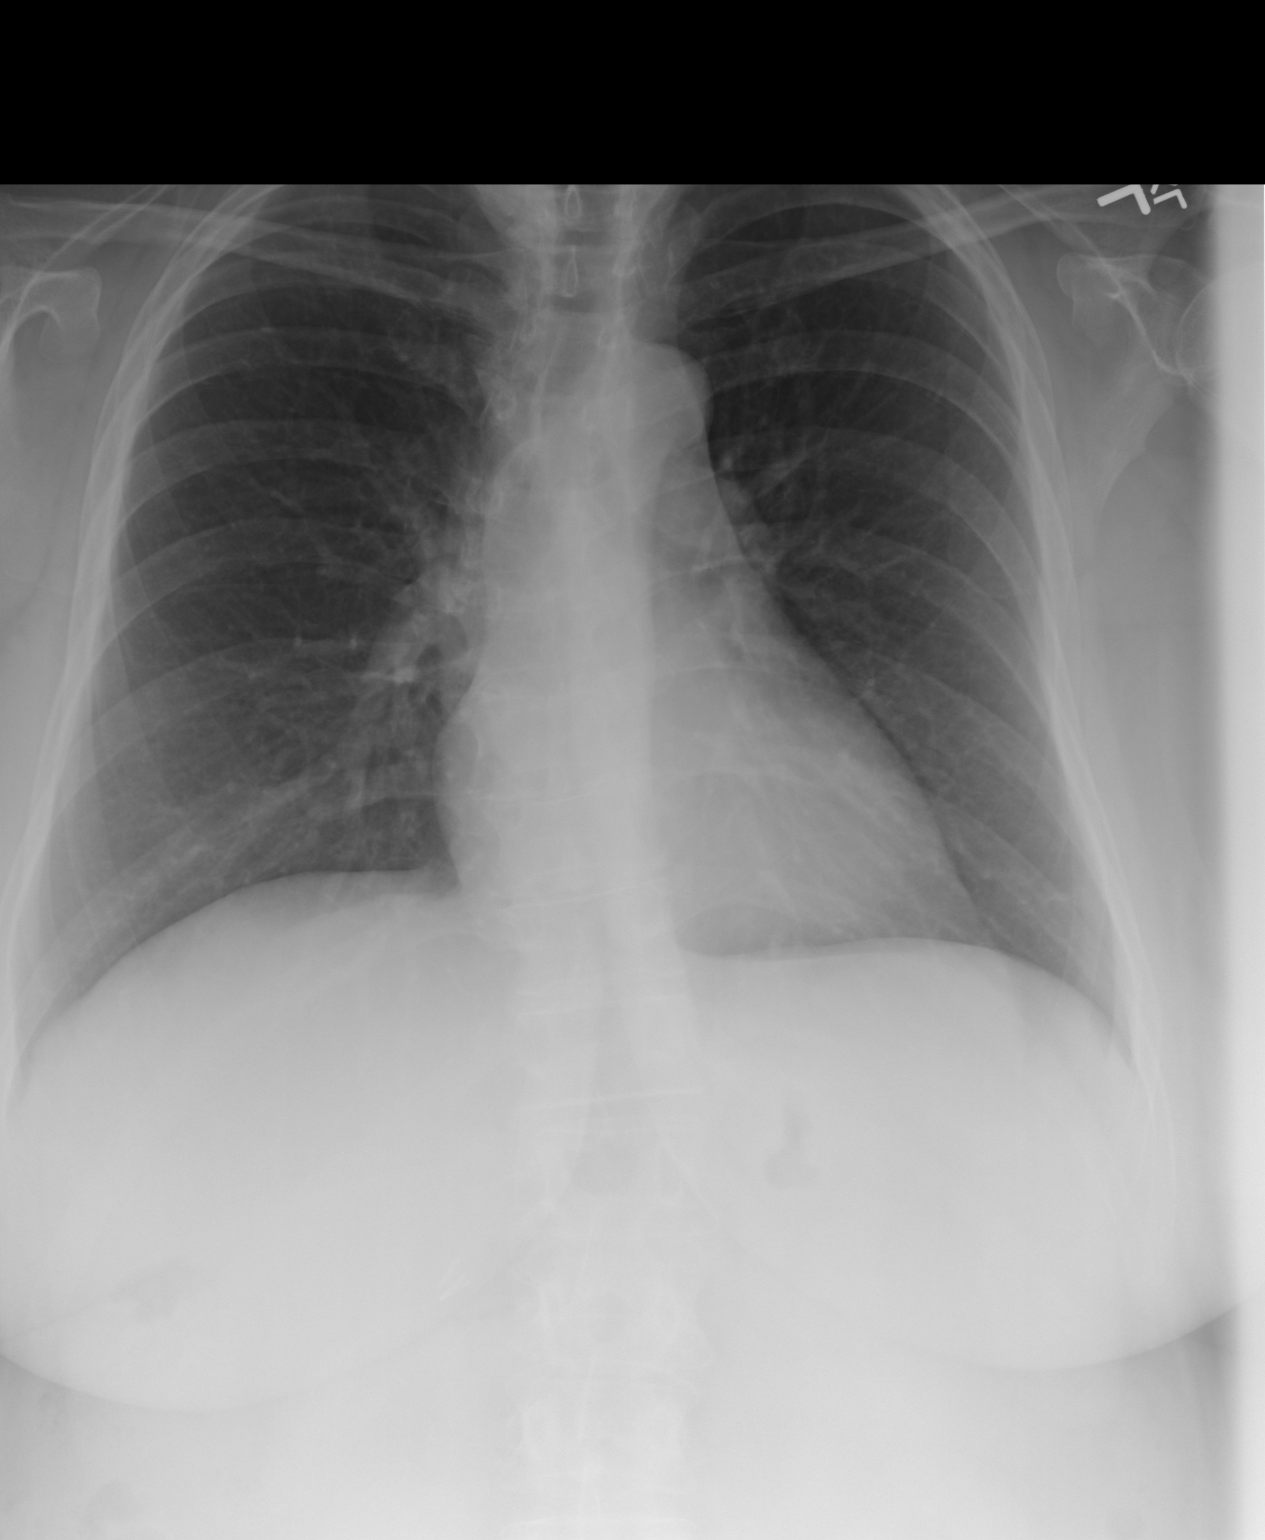
[im 2/2]
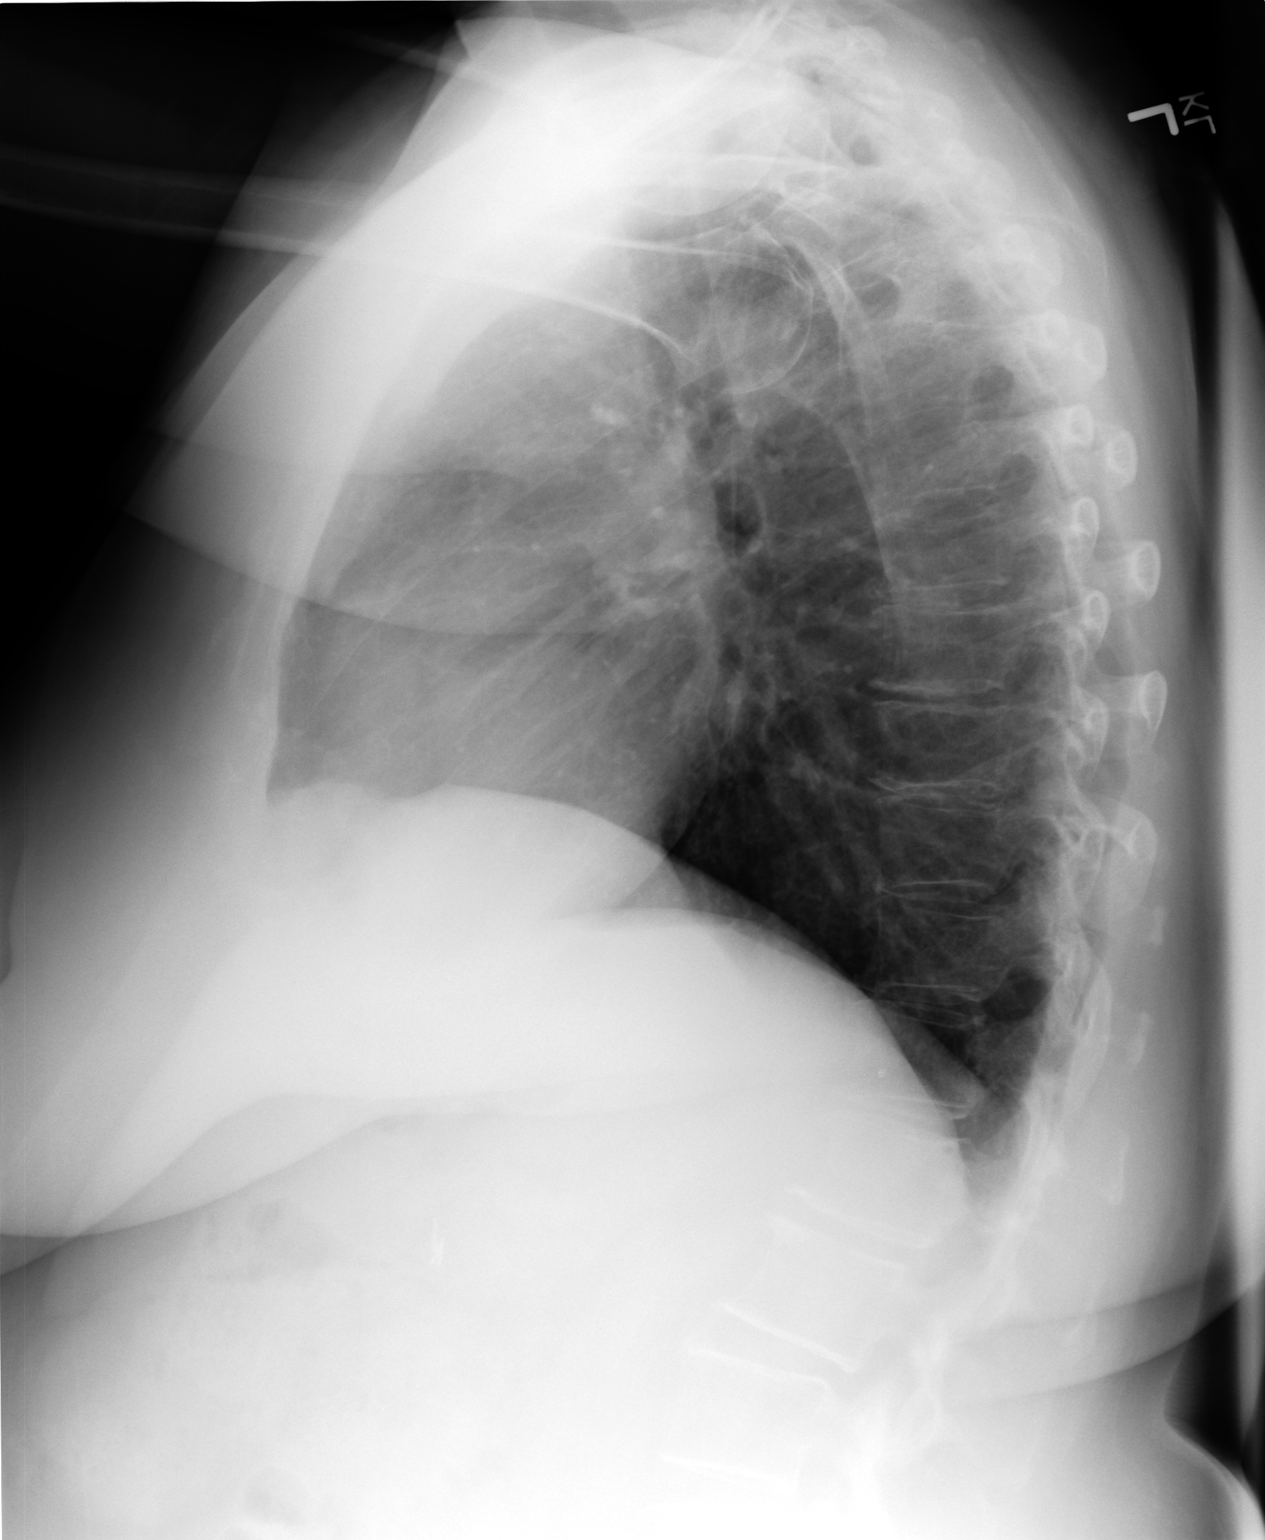

[2 of 2 positions shown; findings below may reference images not displayed]

IMPRESSION: 1. Chest radiograph without evidence of acute cardiopulmonary disease.
2. Comparison made to prior study dated 07/25/2009.

## 2012-11-22 ENCOUNTER — Ambulatory Visit: Payer: Self-pay

## 2013-01-03 ENCOUNTER — Ambulatory Visit: Payer: Self-pay | Admitting: Internal Medicine

## 2013-01-08 ENCOUNTER — Encounter (INDEPENDENT_AMBULATORY_CARE_PROVIDER_SITE_OTHER): Payer: Self-pay

## 2013-01-16 ENCOUNTER — Encounter (INDEPENDENT_AMBULATORY_CARE_PROVIDER_SITE_OTHER): Payer: Self-pay | Admitting: Surgery

## 2013-01-16 ENCOUNTER — Ambulatory Visit (INDEPENDENT_AMBULATORY_CARE_PROVIDER_SITE_OTHER): Payer: PRIVATE HEALTH INSURANCE | Admitting: Surgery

## 2013-01-16 VITALS — BP 118/78 | HR 101 | Temp 97.2°F | Ht 64.25 in | Wt 257.2 lb

## 2013-01-16 DIAGNOSIS — K432 Incisional hernia without obstruction or gangrene: Secondary | ICD-10-CM

## 2013-01-16 NOTE — Progress Notes (Signed)
Patient ID: Kelly Perry, female   DOB: October 24, 1950, 63 y.o.   MRN: [id number]  Chief Complaint  Patient presents with  . New Evaluation    eval hernia    HPI Kelly Perry is a 62 y.o. female.  Referred by Dr. Beverely Risen for evaluation of ventral hernia HPI This is a 62 year old female with a very complex past medical and surgical history who presents with an enlarging ventral hernia. We have fairly limited records from [place].  We have a CT scan report but no images. Apparently the patient underwent a vaginal hysterectomy in 2007 and a laparoscopic cholecystectomy in 2001. In 2011 she apparently was having some pelvic pain and underwent exploratory laparotomy, lysis of adhesions, and a sigmoid colectomy.  This surgery was performed by Dr. Michela Pitcher at [place]. Apparently the patient was discharged home even though she didn't feel well. She was then rehospitalized with a large pelvic abscess from an apparent anastomotic leak. She underwent what sounds like a reexploration with a diverting colostomy on the right and a mucous fistula on the left.  During this time she developed a hernia in her lower midline incision. In June of 2012 she underwent colostomy reversal and repair of her ventral hernia. It is unclear to me whether the hernia was repaired with biologic mesh. Shortly after that surgery she developed a recurrent ventral hernia which has just gotten larger over the last 2 years. She also developed a lower extremity DVT after that last surgery that required anti-coagulation for about 7 months. She does have fairly significant discomfort when she is standing and the hernia is protruding. She denies any obstructive symptoms.  Past Medical History  Diagnosis Date  . Hypertension   . Asthma   Obesity  Past Surgical History  Procedure Laterality Date  . Tonsillectomy    . Abdominal hysterectomy  2007    total  . Spine surgery  2010    cervical neck fusion  . Colon surgery  06/30/10  .  Colostomy  2011  . Colostomy takedown  02/2011    Family History  Problem Relation Age of Onset  . Arthritis Mother   . Hypertension Mother   . Cancer Paternal Aunt     breast    Social History History  Substance Use Topics  . Smoking status: Former Smoker    Quit date: 01/16/1985  . Smokeless tobacco: Not on file  . Alcohol Use: Yes     Comment: occ    Allergies  Allergen Reactions  . Morphine And Related     Elephant on chest  . Novocain (Procaine)     blackout  . Penicillins Rash  . Aspirin     Ringing in the ears  . Sulfur     sweating    Current Outpatient Prescriptions  Medication Sig Dispense Refill  . bisoprolol-hydrochlorothiazide (ZIAC) 2.5-6.25 MG per tablet       . Cholecalciferol (VITAMIN D3) 2000 UNITS TABS Take by mouth.      . DULoxetine (CYMBALTA) 60 MG capsule       . Flavocoxid (LIMBREL) 500 MG CAPS Take 500 mg by mouth 2 (two) times daily.      . furosemide (LASIX) 20 MG tablet       . losartan (COZAAR) 25 MG tablet       . metaxalone (SKELAXIN) 800 MG tablet       . NEXIUM 40 MG capsule       . potassium chloride (K-DUR) 10 MEQ tablet  No current facility-administered medications for this visit.    Review of Systems Review of Systems  Constitutional: Negative for fever, chills and unexpected weight change.  HENT: Negative for hearing loss, congestion, sore throat, trouble swallowing and voice change.   Eyes: Negative for visual disturbance.  Respiratory: Negative for cough and wheezing.   Cardiovascular: Negative for chest pain, palpitations and leg swelling.  Gastrointestinal: Positive for abdominal pain and abdominal distention. Negative for nausea, vomiting, diarrhea, constipation, blood in stool and anal bleeding.  Genitourinary: Negative for hematuria, vaginal bleeding and difficulty urinating.  Musculoskeletal: Negative for arthralgias.  Skin: Negative for rash and wound.  Neurological: Negative for seizures, syncope and  headaches.  Hematological: Negative for adenopathy. Does not bruise/bleed easily.  Psychiatric/Behavioral: Negative for confusion.    Blood pressure 118/78, pulse 101, temperature 97.2 F (36.2 C), temperature source Temporal, height 5' 4.25" (1.632 m), weight 257 lb 3.2 oz (116.665 kg), SpO2 98.00%.  Physical Exam Physical Exam WDWN in NAD HEENT:  EOMI, sclera anicteric Neck:  No masses, no thyromegaly Lungs:  CTA bilaterally; normal respiratory effort CV:  Regular rate and rhythm; no murmurs Abd:  +bowel sounds, soft, obese; healed long midline incision and bilateral upper quadrant ostomy sites Palpable periumbilical hernia; large lower midline hernia with protrusion to the left lower quadrant Ext:  Well-perfused; no edema Skin:  Warm, dry; no sign of jaundice  Data Reviewed CT report from [facility] on 01/03/13 - Large lower abdominal/ upper pelvic wall hernia containing mesenteric fat as well as loops of normal caliber small bowel, shallow periumbilical/ mid-abdominal hernia containing fat and a portion of the mid-transverse colon.      Assessment    Large recurrent ventral hernia and separate periumbilical hernia.   Complex medical/ surgical history Obesity     Plan    Abdominal binder/ walking regimen to try to lose weight Obtain records from [facility]  Obtain images from CT scan  Will need open ventral hernia repair/ lysis of adhesions/ repair with mesh.  The surgical procedure has been discussed with the patient.  Potential risks, benefits, alternative treatments, and expected outcomes have been explained.  All of the patient's questions at this time have been answered.  The likelihood of reaching the patient's treatment goal is good.  The patient understand the proposed surgical procedure and wishes to proceed.  We will call the patient to schedule once we have received the records and CT scan.        Kelly Gehrig K. 01/16/2013, 1:23 PM

## 2013-01-16 NOTE — Patient Instructions (Signed)
After we have obtained the records and CT scan from [place], we will call you to schedule the surgery.

## 2013-01-25 ENCOUNTER — Encounter (INDEPENDENT_AMBULATORY_CARE_PROVIDER_SITE_OTHER): Payer: Self-pay

## 2013-01-25 ENCOUNTER — Telehealth (INDEPENDENT_AMBULATORY_CARE_PROVIDER_SITE_OTHER): Payer: Self-pay | Admitting: General Surgery

## 2013-01-25 ENCOUNTER — Other Ambulatory Visit (INDEPENDENT_AMBULATORY_CARE_PROVIDER_SITE_OTHER): Payer: Self-pay | Admitting: Surgery

## 2013-01-25 NOTE — Telephone Encounter (Signed)
Called patient to let her know that we had received all of her notes and Dr [name] has reviewed them and he filled out orders for her to be schedule for surgery. The orders for up at the schedulers

## 2013-02-16 ENCOUNTER — Encounter (HOSPITAL_COMMUNITY): Payer: Self-pay | Admitting: Pharmacy Technician

## 2013-02-22 ENCOUNTER — Encounter (HOSPITAL_COMMUNITY)
Admission: RE | Admit: 2013-02-22 | Discharge: 2013-02-22 | Disposition: A | Discharge status: Home / Self Care | Payer: 59 | Source: Ambulatory Visit | Attending: Surgery | Admitting: Surgery

## 2013-02-22 ENCOUNTER — Encounter (HOSPITAL_COMMUNITY): Payer: Self-pay

## 2013-02-22 ENCOUNTER — Ambulatory Visit (HOSPITAL_COMMUNITY)
Admission: RE | Admit: 2013-02-22 | Discharge: 2013-02-22 | Disposition: A | Discharge status: Home / Self Care | Payer: 59 | Source: Ambulatory Visit | Attending: Surgery | Admitting: Surgery

## 2013-02-22 DIAGNOSIS — K439 Ventral hernia without obstruction or gangrene: Secondary | ICD-10-CM | POA: Diagnosis not present

## 2013-02-22 DIAGNOSIS — Z01818 Encounter for other preprocedural examination: Secondary | ICD-10-CM | POA: Diagnosis present

## 2013-02-22 HISTORY — DX: Spontaneous ecchymoses: R23.3

## 2013-02-22 HISTORY — DX: Sleep apnea, unspecified: G47.30

## 2013-02-22 HISTORY — DX: Peripheral vascular disease, unspecified: I73.9

## 2013-02-22 HISTORY — DX: Gastro-esophageal reflux disease without esophagitis: K21.9

## 2013-02-22 HISTORY — DX: Unspecified osteoarthritis, unspecified site: M19.90

## 2013-02-22 HISTORY — DX: Nonrheumatic mitral (valve) prolapse: I34.1

## 2013-02-22 HISTORY — DX: Other skin changes: R23.8

## 2013-02-22 LAB — CBC
HCT: 39.4 % (ref 36.0–46.0)
Hemoglobin: 13.4 g/dL (ref 12.0–15.0)
MCH: 29.8 pg (ref 26.0–34.0)
MCHC: 34 g/dL (ref 30.0–36.0)
MCV: 87.6 fL (ref 78.0–100.0)

## 2013-02-22 LAB — BASIC METABOLIC PANEL
BUN: 12 mg/dL (ref 6–23)
GFR calc non Af Amer: 90 mL/min — ABNORMAL LOW (ref >90)
Glucose, Bld: 117 mg/dL — ABNORMAL HIGH (ref 70–99)
Potassium: 3.7 mEq/L (ref 3.5–5.1)

## 2013-02-22 LAB — SURGICAL PCR SCREEN: Staphylococcus aureus: NEGATIVE

## 2013-02-22 NOTE — Progress Notes (Signed)
At PST visit patient stated is having a umbilical hernia repair as well- consent not signed today. States has appt with Dr [name] tomorrow and will discuss with him. Also instructed to ask him about if she needs to stop the Limbrel??  Instructed to bring CPAP mask and tubing with her to hospital

## 2013-02-22 NOTE — Progress Notes (Signed)
OV H Hiles NP 5/14, eccho 7/13 all om chart

## 2013-02-22 NOTE — Patient Instructions (Addendum)
20 Shyra Emile  02/22/2013   Your procedure is scheduled on:  02/26/13 MONDAY  Report to [facility] at   0730    AM.  Call this number if you have problems the morning of surgery: [phone]          REMEMBER---Do not eat food  Or drink :After Midnight.SUNDAY NIGHT   Take these medicines the morning of surgery with A SIP OF WATER: NEXIUM, SKELAXIN   .  Contacts, dentures or partial plates can not be worn to surgery  Leave suitcase in the car. After surgery it may be brought to your room.  For patients admitted to the hospital, checkout time is 11:00 AM day of  discharge.             SPECIAL INSTRUCTIONS- SEE [facility] PREPARING FOR SURGERY INSTRUCTION SHEET-     DO NOT WEAR JEWELRY, LOTIONS, POWDERS, OR PERFUMES.  WOMEN-- DO NOT SHAVE LEGS OR UNDERARMS FOR 12 HOURS BEFORE SHOWERS. MEN MAY SHAVE FACE.  Patients discharged the day of surgery will not be allowed to drive home. IF going home the day of surgery, you must have a driver and someone to stay with you for the first 24 hours  Name and phone number of your driver:   admission                                                                     Please read over the following fact sheets that you were given: MRSA Information                                                                                  Avary Eichenberger  PST [phone]  [phone]                 FAILURE TO FOLLOW THESE INSTRUCTIONS MAY RESULT IN  CANCELLATION   OF YOUR SURGERY                                                  Patient Signature _____________________________

## 2013-02-23 ENCOUNTER — Encounter (INDEPENDENT_AMBULATORY_CARE_PROVIDER_SITE_OTHER): Payer: Self-pay | Admitting: Surgery

## 2013-02-23 ENCOUNTER — Ambulatory Visit (INDEPENDENT_AMBULATORY_CARE_PROVIDER_SITE_OTHER): Payer: PRIVATE HEALTH INSURANCE | Admitting: Surgery

## 2013-02-23 VITALS — BP 120/70 | HR 92 | Temp 97.5°F | Resp 16 | Ht 64.25 in | Wt 256.2 lb

## 2013-02-23 DIAGNOSIS — K432 Incisional hernia without obstruction or gangrene: Secondary | ICD-10-CM

## 2013-02-23 NOTE — Progress Notes (Signed)
The patient is scheduled for open repair of 2 recurrent ventral hernias on 02/26/13. She had a lot of questions so she scheduled an appointment to come in today. I tried to answer all of her questions to the best of my ability.  Even though one of the hernias is near her umbilicus, the consent form will read repair of ventral hernias. I explained to her that a ventral hernia includes umbilical hernias.  She may continue her arthritis medication. This does not seem to have any bleeding side effects so it should be safe for surgery.  We discussed her postoperative pain management. We will use a PCA pump likely with Dilaudid and will hopefully transition to either PO Percocet with breakthru Dilaudid. The patient had a lot of pain issues after her previous surgeries at [facility].    The patient has a history of lower extremity DVTs. We will use chemical DVT prophylaxis after surgery. My initial plan is not to send her home on blood thinners but this may change depending on her hospital course.  At her last admission the patient had a lot of gastritis and reflux symptoms. She used Carafate. I explained that we would keep her on Protonix after surgery. If she remains symptomatic she may need Carafate but this is not usually first-line treatment for stress gastritis.  This seemed to answer all of her questions at this visit. We will proceed with her surgery on Monday. Labs and preoperative x-ray seemed to be normal.  [name]. [name], MD, [facility]  General/ Trauma Surgery  02/23/2013 1:03 PM

## 2013-02-25 MED ORDER — VANCOMYCIN HCL 10 G IV SOLR
1500.0000 mg | INTRAVENOUS | Status: AC
  Administered 2013-02-26: 1500 mg via INTRAVENOUS
  Filled 2013-02-25: qty 1500

## 2013-02-26 ENCOUNTER — Encounter (HOSPITAL_COMMUNITY): Payer: Self-pay | Admitting: Anesthesiology

## 2013-02-26 ENCOUNTER — Encounter (HOSPITAL_COMMUNITY)
Admission: RE | Disposition: A | Discharge status: Home / Self Care | Payer: Self-pay | Source: Ambulatory Visit | Attending: Surgery

## 2013-02-26 ENCOUNTER — Inpatient Hospital Stay (HOSPITAL_COMMUNITY): Payer: 59 | Admitting: Anesthesiology

## 2013-02-26 ENCOUNTER — Encounter (HOSPITAL_COMMUNITY): Payer: Self-pay | Admitting: *Deleted

## 2013-02-26 ENCOUNTER — Inpatient Hospital Stay (HOSPITAL_COMMUNITY)
Admission: RE | Admit: 2013-02-26 | Discharge: 2013-03-03 | DRG: 355 | Disposition: A | Discharge status: Home / Self Care | Payer: 59 | Source: Ambulatory Visit | Attending: Surgery | Admitting: Surgery

## 2013-02-26 DIAGNOSIS — Z87891 Personal history of nicotine dependence: Secondary | ICD-10-CM

## 2013-02-26 DIAGNOSIS — K439 Ventral hernia without obstruction or gangrene: Secondary | ICD-10-CM

## 2013-02-26 DIAGNOSIS — I10 Essential (primary) hypertension: Secondary | ICD-10-CM | POA: Diagnosis present

## 2013-02-26 DIAGNOSIS — K432 Incisional hernia without obstruction or gangrene: Principal | ICD-10-CM | POA: Diagnosis present

## 2013-02-26 DIAGNOSIS — E876 Hypokalemia: Secondary | ICD-10-CM | POA: Diagnosis present

## 2013-02-26 DIAGNOSIS — E669 Obesity, unspecified: Secondary | ICD-10-CM | POA: Diagnosis present

## 2013-02-26 HISTORY — PX: VENTRAL HERNIA REPAIR: SHX424

## 2013-02-26 HISTORY — PX: LYSIS OF ADHESION: SHX5961

## 2013-02-26 HISTORY — PX: INSERTION OF MESH: SHX5868

## 2013-02-26 SURGERY — REPAIR, HERNIA, VENTRAL
Anesthesia: General | Wound class: Clean

## 2013-02-26 MED ORDER — ONDANSETRON HCL 4 MG/2ML IJ SOLN
INTRAMUSCULAR | Status: DC | PRN
  Administered 2013-02-26: 4 mg via INTRAVENOUS

## 2013-02-26 MED ORDER — DIPHENHYDRAMINE HCL 12.5 MG/5ML PO ELIX
12.5000 mg | ORAL_SOLUTION | Freq: Four times a day (QID) | ORAL | Status: DC | PRN
  Filled 2013-02-26: qty 5

## 2013-02-26 MED ORDER — DIPHENHYDRAMINE HCL 50 MG/ML IJ SOLN: 12.5000 mg | Freq: Four times a day (QID) | INTRAMUSCULAR | Status: DC | PRN

## 2013-02-26 MED ORDER — VANCOMYCIN HCL IN DEXTROSE 1-5 GM/200ML-% IV SOLN
1000.0000 mg | Freq: Two times a day (BID) | INTRAVENOUS | Status: AC
  Administered 2013-02-26 – 2013-02-27 (×2): 1000 mg via INTRAVENOUS
  Filled 2013-02-26 (×2): qty 200

## 2013-02-26 MED ORDER — ONDANSETRON HCL 4 MG/2ML IJ SOLN
4.0000 mg | Freq: Four times a day (QID) | INTRAMUSCULAR | Status: DC | PRN
  Administered 2013-02-27 (×2): 4 mg via INTRAVENOUS
  Filled 2013-02-26: qty 2

## 2013-02-26 MED ORDER — NEOSTIGMINE METHYLSULFATE 1 MG/ML IJ SOLN
INTRAMUSCULAR | Status: DC | PRN
  Administered 2013-02-26: 4 mg via INTRAVENOUS

## 2013-02-26 MED ORDER — HYDROMORPHONE HCL PF 1 MG/ML IJ SOLN
INTRAMUSCULAR | Status: DC | PRN
  Administered 2013-02-26 (×2): 0.5 mg via INTRAVENOUS

## 2013-02-26 MED ORDER — LIDOCAINE HCL (CARDIAC) 20 MG/ML IV SOLN
INTRAVENOUS | Status: DC | PRN
  Administered 2013-02-26: 60 mg via INTRAVENOUS

## 2013-02-26 MED ORDER — LACTATED RINGERS IV SOLN
INTRAVENOUS | Status: DC | PRN
  Administered 2013-02-26 (×2): via INTRAVENOUS

## 2013-02-26 MED ORDER — SODIUM CHLORIDE 0.9 % IJ SOLN: 9.0000 mL | INTRAMUSCULAR | Status: DC | PRN

## 2013-02-26 MED ORDER — LACTATED RINGERS IV SOLN
INTRAVENOUS | Status: DC
  Administered 2013-02-26: 1000 mL via INTRAVENOUS

## 2013-02-26 MED ORDER — SODIUM CHLORIDE 0.9 % IV SOLN
INTRAVENOUS | Status: DC
  Administered 2013-02-26 – 2013-02-28 (×4): via INTRAVENOUS

## 2013-02-26 MED ORDER — GLYCOPYRROLATE 0.2 MG/ML IJ SOLN
INTRAMUSCULAR | Status: DC | PRN
  Administered 2013-02-26: 0.6 mg via INTRAVENOUS

## 2013-02-26 MED ORDER — NALOXONE HCL 0.4 MG/ML IJ SOLN: 0.4000 mg | INTRAMUSCULAR | Status: DC | PRN

## 2013-02-26 MED ORDER — FLAVOCOXID 500 MG PO CAPS
500.0000 mg | ORAL_CAPSULE | Freq: Two times a day (BID) | ORAL | Status: DC
  Administered 2013-02-27 – 2013-03-03 (×8): 500 mg via ORAL

## 2013-02-26 MED ORDER — ONDANSETRON HCL 4 MG/2ML IJ SOLN
4.0000 mg | Freq: Four times a day (QID) | INTRAMUSCULAR | Status: DC | PRN
  Administered 2013-02-28: 4 mg via INTRAVENOUS
  Filled 2013-02-26 (×2): qty 2

## 2013-02-26 MED ORDER — MIDAZOLAM HCL 5 MG/5ML IJ SOLN
INTRAMUSCULAR | Status: DC | PRN
  Administered 2013-02-26: 2 mg via INTRAVENOUS

## 2013-02-26 MED ORDER — 0.9 % SODIUM CHLORIDE (POUR BTL) OPTIME
TOPICAL | Status: DC | PRN
  Administered 2013-02-26: 1000 mL

## 2013-02-26 MED ORDER — HYDROMORPHONE 0.3 MG/ML IV SOLN
INTRAVENOUS | Status: DC
  Administered 2013-02-26: 0.3 mg via INTRAVENOUS
  Administered 2013-02-26: 3 mg via INTRAVENOUS
  Administered 2013-02-26: 2.4 mg via INTRAVENOUS
  Administered 2013-02-27: 0.9 mg via INTRAVENOUS
  Administered 2013-02-27: 3.3 mg via INTRAVENOUS
  Administered 2013-02-27: 01:00:00 via INTRAVENOUS
  Administered 2013-02-27: 0.6 mg via INTRAVENOUS
  Administered 2013-02-27: 0.3 mg via INTRAVENOUS
  Administered 2013-02-27: 4.1 mg via INTRAVENOUS
  Administered 2013-02-27: 21:00:00 via INTRAVENOUS
  Administered 2013-02-27: 0.9 mg via INTRAVENOUS
  Administered 2013-02-28: 0.3 mg via INTRAVENOUS
  Administered 2013-02-28: 1.2 mg via INTRAVENOUS
  Administered 2013-02-28: 1.3 mg via INTRAVENOUS
  Administered 2013-02-28: 0.6 mg via INTRAVENOUS
  Administered 2013-02-28: 1.5 mg via INTRAVENOUS
  Administered 2013-02-28: 0.3 mg via INTRAVENOUS
  Administered 2013-03-01: 0.9 mg via INTRAVENOUS
  Administered 2013-03-01: 1.8 mg via INTRAVENOUS
  Filled 2013-02-26 (×2): qty 25

## 2013-02-26 MED ORDER — PROPOFOL 10 MG/ML IV BOLUS
INTRAVENOUS | Status: DC | PRN
  Administered 2013-02-26: 200 mg via INTRAVENOUS

## 2013-02-26 MED ORDER — ROCURONIUM BROMIDE 100 MG/10ML IV SOLN
INTRAVENOUS | Status: DC | PRN
  Administered 2013-02-26: 30 mg via INTRAVENOUS
  Administered 2013-02-26 (×2): 10 mg via INTRAVENOUS
  Administered 2013-02-26: 20 mg via INTRAVENOUS
  Administered 2013-02-26: 10 mg via INTRAVENOUS

## 2013-02-26 MED ORDER — FENTANYL CITRATE 0.05 MG/ML IJ SOLN
INTRAMUSCULAR | Status: DC | PRN
  Administered 2013-02-26 (×2): 100 ug via INTRAVENOUS
  Administered 2013-02-26: 50 ug via INTRAVENOUS

## 2013-02-26 MED ORDER — LOSARTAN POTASSIUM 25 MG PO TABS
25.0000 mg | ORAL_TABLET | Freq: Two times a day (BID) | ORAL | Status: DC
  Administered 2013-02-26 – 2013-03-03 (×9): 25 mg via ORAL
  Filled 2013-02-26 (×14): qty 1

## 2013-02-26 MED ORDER — HYDROMORPHONE HCL PF 1 MG/ML IJ SOLN
0.2500 mg | INTRAMUSCULAR | Status: DC | PRN
  Administered 2013-02-26 (×2): 0.5 mg via INTRAVENOUS

## 2013-02-26 MED ORDER — ONDANSETRON HCL 4 MG PO TABS
4.0000 mg | ORAL_TABLET | Freq: Four times a day (QID) | ORAL | Status: DC | PRN
  Administered 2013-03-02: 4 mg via ORAL
  Filled 2013-02-26 (×2): qty 1

## 2013-02-26 MED ORDER — BISOPROLOL FUMARATE 5 MG PO TABS
2.5000 mg | ORAL_TABLET | Freq: Once | ORAL | Status: AC
  Administered 2013-02-26: 2.5 mg via ORAL
  Filled 2013-02-26: qty 0.5

## 2013-02-26 MED ORDER — BISOPROLOL-HYDROCHLOROTHIAZIDE 2.5-6.25 MG PO TABS
1.0000 | ORAL_TABLET | Freq: Every morning | ORAL | Status: DC
  Administered 2013-02-26 – 2013-03-03 (×6): 1 via ORAL
  Filled 2013-02-26 (×7): qty 1

## 2013-02-26 MED ORDER — PANTOPRAZOLE SODIUM 40 MG PO TBEC
40.0000 mg | DELAYED_RELEASE_TABLET | Freq: Every day | ORAL | Status: DC
  Administered 2013-02-26 – 2013-03-03 (×6): 40 mg via ORAL
  Filled 2013-02-26 (×7): qty 1

## 2013-02-26 MED ORDER — ACETAMINOPHEN 10 MG/ML IV SOLN
INTRAVENOUS | Status: DC | PRN
  Administered 2013-02-26: 1000 mg via INTRAVENOUS

## 2013-02-26 MED ORDER — SUCCINYLCHOLINE CHLORIDE 20 MG/ML IJ SOLN
INTRAMUSCULAR | Status: DC | PRN
  Administered 2013-02-26: 140 mg via INTRAVENOUS

## 2013-02-26 MED ORDER — FUROSEMIDE 20 MG PO TABS
20.0000 mg | ORAL_TABLET | Freq: Two times a day (BID) | ORAL | Status: DC
  Administered 2013-02-26 – 2013-03-03 (×10): 20 mg via ORAL
  Filled 2013-02-26 (×14): qty 1

## 2013-02-26 MED ORDER — DULOXETINE HCL 60 MG PO CPEP
60.0000 mg | ORAL_CAPSULE | Freq: Every evening | ORAL | Status: DC
  Administered 2013-02-26 – 2013-03-02 (×5): 60 mg via ORAL
  Filled 2013-02-26 (×9): qty 1

## 2013-02-26 MED ORDER — PROMETHAZINE HCL 25 MG/ML IJ SOLN: 6.2500 mg | INTRAMUSCULAR | Status: DC | PRN

## 2013-02-26 MED ORDER — ENOXAPARIN SODIUM 40 MG/0.4ML ~~LOC~~ SOLN
40.0000 mg | SUBCUTANEOUS | Status: DC
  Administered 2013-02-27 – 2013-03-03 (×5): 40 mg via SUBCUTANEOUS
  Filled 2013-02-26 (×6): qty 0.4

## 2013-02-26 SURGICAL SUPPLY — 43 items
BINDER ABD UNIV 12 45-62 (WOUND CARE) ×1 IMPLANT
BINDER ABDOMINAL 46IN 62IN (WOUND CARE) ×2
BLADE HEX COATED 2.75 (ELECTRODE) ×2 IMPLANT
CANISTER SUCTION 2500CC (MISCELLANEOUS) ×2 IMPLANT
CLOTH BEACON ORANGE TIMEOUT ST (SAFETY) ×2 IMPLANT
COUNTER NEEDLE 20 DBL MAG RED (NEEDLE) ×2 IMPLANT
DECANTER SPIKE VIAL GLASS SM (MISCELLANEOUS) IMPLANT
DEVICE SECURE STRAP 25 ABSORB (INSTRUMENTS) ×4 IMPLANT
DRAIN CHANNEL 19F RND (DRAIN) ×4 IMPLANT
DRAPE LAPAROSCOPIC ABDOMINAL (DRAPES) ×2 IMPLANT
DRAPE UTILITY XL STRL (DRAPES) ×2 IMPLANT
ELECT REM PT RETURN 9FT ADLT (ELECTROSURGICAL) ×2
ELECTRODE REM PT RTRN 9FT ADLT (ELECTROSURGICAL) ×1 IMPLANT
GLOVE BIO SURGEON STRL SZ7 (GLOVE) ×2 IMPLANT
GLOVE BIOGEL PI IND STRL 7.0 (GLOVE) ×1 IMPLANT
GLOVE BIOGEL PI IND STRL 7.5 (GLOVE) ×1 IMPLANT
GLOVE BIOGEL PI INDICATOR 7.0 (GLOVE) ×1
GLOVE BIOGEL PI INDICATOR 7.5 (GLOVE) ×1
GOWN STRL NON-REIN LRG LVL3 (GOWN DISPOSABLE) ×4 IMPLANT
GOWN STRL REIN XL XLG (GOWN DISPOSABLE) ×2 IMPLANT
KIT BASIN OR (CUSTOM PROCEDURE TRAY) ×2 IMPLANT
MESH VENT ST 27.4X34.9CM OVL (Mesh General) ×2 IMPLANT
NEEDLE HYPO 22GX1.5 SAFETY (NEEDLE) IMPLANT
PACK GENERAL/GYN (CUSTOM PROCEDURE TRAY) ×2 IMPLANT
SPONGE GAUZE 4X4 12PLY (GAUZE/BANDAGES/DRESSINGS) ×2 IMPLANT
SPONGE LAP 18X18 X RAY DECT (DISPOSABLE) ×2 IMPLANT
STAPLER VISISTAT 35W (STAPLE) ×2 IMPLANT
SUT ETHILON 2 0 PS N (SUTURE) ×2 IMPLANT
SUT ETHILON 3 0 PS 1 (SUTURE) ×4 IMPLANT
SUT NOVA 1 T20/GS 25DT (SUTURE) ×12 IMPLANT
SUT NOVA NAB GS-21 0 18 T12 DT (SUTURE) ×2 IMPLANT
SUT PDS AB 1 CTX 36 (SUTURE) IMPLANT
SUT PROLENE 0 CT 1 CR/8 (SUTURE) IMPLANT
SUT SILK 3 0 (SUTURE) ×2
SUT SILK 3-0 18XBRD TIE 12 (SUTURE) ×1 IMPLANT
SUT VIC AB 2-0 CT1 27 (SUTURE) ×2
SUT VIC AB 2-0 CT1 27XBRD (SUTURE) ×1 IMPLANT
SUT VIC AB 3-0 SH 27 (SUTURE) ×2
SUT VIC AB 3-0 SH 27XBRD (SUTURE) ×1 IMPLANT
SYR CONTROL 10ML LL (SYRINGE) IMPLANT
TAPE CLOTH SURG 6X10 WHT LF (GAUZE/BANDAGES/DRESSINGS) ×2 IMPLANT
TOWEL OR 17X26 10 PK STRL BLUE (TOWEL DISPOSABLE) ×2 IMPLANT
TRAY FOLEY CATH 14FRSI W/METER (CATHETERS) ×2 IMPLANT

## 2013-02-26 NOTE — Anesthesia Preprocedure Evaluation (Addendum)
Anesthesia Evaluation  Patient identified by MRN, date of birth, ID band Patient awake    Reviewed: Allergy & Precautions, H&P , NPO status , Patient's Chart, lab work & pertinent test results  Airway Mallampati: III TM Distance: <3 FB Neck ROM: Full    Dental no notable dental hx.    Pulmonary sleep apnea ,  breath sounds clear to auscultation  Pulmonary exam normal       Cardiovascular hypertension, Pt. on medications DVT Rhythm:Regular Rate:Normal     Neuro/Psych negative neurological ROS  negative psych ROS   GI/Hepatic Neg liver ROS,   Endo/Other  negative endocrine ROSMorbid obesity  Renal/GU negative Renal ROS  negative genitourinary   Musculoskeletal negative musculoskeletal ROS (+)   Abdominal   Peds negative pediatric ROS (+)  Hematology negative hematology ROS (+)   Anesthesia Other Findings   Reproductive/Obstetrics negative OB ROS                           Anesthesia Physical Anesthesia Plan  ASA: III  Anesthesia Plan: General   Post-op Pain Management:    Induction: Intravenous  Airway Management Planned: Oral ETT  Additional Equipment:   Intra-op Plan:   Post-operative Plan: Extubation in OR  Informed Consent: I have reviewed the patients History and Physical, chart, labs and discussed the procedure including the risks, benefits and alternatives for the proposed anesthesia with the patient or authorized representative who has indicated his/her understanding and acceptance.   Dental advisory given  Plan Discussed with: CRNA and Surgeon  Anesthesia Plan Comments:         Anesthesia Quick Evaluation

## 2013-02-26 NOTE — Transfer of Care (Signed)
Immediate Anesthesia Transfer of Care Note  Patient: Kelly Perry  Procedure(s) Performed: Procedure(s): HERNIA REPAIR VENTRAL ADULT (N/A) INSERTION OF MESH (N/A) LYSIS OF ADHESION (N/A)  Patient Location: PACU  Anesthesia Type:General  Level of Consciousness: sedated, patient cooperative and responds to stimulation  Airway & Oxygen Therapy: Patient Spontanous Breathing and Patient connected to face mask oxygen  Post-op Assessment: Report given to PACU RN and Post -op Vital signs reviewed and stable  Post vital signs: Reviewed and stable  Complications: No apparent anesthesia complications

## 2013-02-26 NOTE — H&P (Signed)
eval hernia   HPI  Kelly Perry is a 62 y.o. female. Referred by Dr. Beverely Risen for evaluation of ventral hernia  HPI  This is a 62 year old female with a very complex past medical and surgical history who presents with an enlarging ventral hernia. We have fairly limited records from [place]. We have a CT scan report but no images. Apparently the patient underwent a vaginal hysterectomy in 2007 and a laparoscopic cholecystectomy in 2001. In 2011 she apparently was having some pelvic pain and underwent exploratory laparotomy, lysis of adhesions, and a sigmoid colectomy. This surgery was performed by Dr. Michela Pitcher at [place]. Apparently the patient was discharged home even though she didn't feel well. She was then rehospitalized with a large pelvic abscess from an apparent anastomotic leak. She underwent what sounds like a reexploration with a diverting colostomy on the right and a mucous fistula on the left. During this time she developed a hernia in her lower midline incision. In June of 2012 she underwent colostomy reversal and repair of her ventral hernia.  Shortly after that surgery she developed a recurrent ventral hernia which has just gotten larger over the last 2 years. She also developed a lower extremity DVT after that last surgery that required anti-coagulation for about 7 months. She does have fairly significant discomfort when she is standing and the hernia is protruding. She denies any obstructive symptoms.  Past Medical History   Diagnosis  Date   .  Hypertension    .  Asthma    Obesity  Past Surgical History   Procedure  Laterality  Date   .  Tonsillectomy     .  Abdominal hysterectomy   2007     total   .  Spine surgery   2010     cervical neck fusion   .  Colon surgery   06/30/10   .  Colostomy   2011   .  Colostomy takedown   02/2011    Family History   Problem  Relation  Age of Onset   .  Arthritis  Mother    .  Hypertension  Mother    .  Cancer  Paternal Aunt     breast   Social History  History   Substance Use Topics   .  Smoking status:  Former Smoker     Quit date:  01/16/1985   .  Smokeless tobacco:  Not on file   .  Alcohol Use:  Yes      Comment: occ    Allergies   Allergen  Reactions   .  Morphine And Related      Elephant on chest   .  Novocain (Procaine)      blackout   .  Penicillins  Rash   .  Aspirin      Ringing in the ears   .  Sulfur      sweating    Current Outpatient Prescriptions   Medication  Sig  Dispense  Refill   .  bisoprolol-hydrochlorothiazide (ZIAC) 2.5-6.25 MG per tablet      .  Cholecalciferol (VITAMIN D3) 2000 UNITS TABS  Take by mouth.     .  DULoxetine (CYMBALTA) 60 MG capsule      .  Flavocoxid (LIMBREL) 500 MG CAPS  Take 500 mg by mouth 2 (two) times daily.     .  furosemide (LASIX) 20 MG tablet      .  losartan (COZAAR) 25 MG  tablet      .  metaxalone (SKELAXIN) 800 MG tablet      .  NEXIUM 40 MG capsule      .  potassium chloride (K-DUR) 10 MEQ tablet       No current facility-administered medications for this visit.   Review of Systems  Review of Systems  Constitutional: Negative for fever, chills and unexpected weight change.  HENT: Negative for hearing loss, congestion, sore throat, trouble swallowing and voice change.  Eyes: Negative for visual disturbance.  Respiratory: Negative for cough and wheezing.  Cardiovascular: Negative for chest pain, palpitations and leg swelling.  Gastrointestinal: Positive for abdominal pain and abdominal distention. Negative for nausea, vomiting, diarrhea, constipation, blood in stool and anal bleeding.  Genitourinary: Negative for hematuria, vaginal bleeding and difficulty urinating.  Musculoskeletal: Negative for arthralgias.  Skin: Negative for rash and wound.  Neurological: Negative for seizures, syncope and headaches.  Hematological: Negative for adenopathy. Does not bruise/bleed easily.  Psychiatric/Behavioral: Negative for confusion.  Blood pressure  118/78, pulse 101, temperature 97.2 F (36.2 C), temperature source Temporal, height 5' 4.25" (1.632 m), weight 257 lb 3.2 oz (116.665 kg), SpO2 98.00%.  Physical Exam  Physical Exam  WDWN in NAD  HEENT: EOMI, sclera anicteric  Neck: No masses, no thyromegaly  Lungs: CTA bilaterally; normal respiratory effort  CV: Regular rate and rhythm; no murmurs  Abd: +bowel sounds, soft, obese; healed long midline incision and bilateral upper quadrant ostomy sites  Palpable periumbilical hernia; large lower midline hernia with protrusion to the left lower quadrant  Ext: Well-perfused; no edema  Skin: Warm, dry; no sign of jaundice  Data Reviewed  [facility] on 01/03/13 - Large lower abdominal/ upper pelvic wall hernia containing mesenteric fat as well as loops of normal caliber small bowel, shallow periumbilical/ mid-abdominal hernia containing fat and a portion of the mid-transverse colon.  Assessment  Large recurrent ventral hernia and separate periumbilical hernia.  Complex medical/ surgical history  Obesity  Plan    Will need open ventral hernia repair/ lysis of adhesions/ repair with mesh. The surgical procedure has been discussed with the patient. Potential risks, benefits, alternative treatments, and expected outcomes have been explained. All of the patient's questions at this time have been answered. The likelihood of reaching the patient's treatment goal is good. The patient understand the proposed surgical procedure and wishes to proceed.   [name]. [name], MD, [facility]  General/ Trauma Surgery  02/26/2013 9:21 AM

## 2013-02-26 NOTE — Op Note (Signed)
Ventral Hernia Repair Procedure Note  Indications: Symptomatic ventral hernias  Pre-operative Diagnosis: Ventral hernias x 4   Post-operative Diagnosis: Ventral hernia x 4  Surgeon: Zabian Swayne K.   Assistants: Dr. Romie Levee  Anesthesia: General endotracheal anesthesia  ASA Class: 2  Procedure Details  The patient was seen in the Holding Room. The risks, benefits, complications, treatment options, and expected outcomes were discussed with the patient. The possibilities of reaction to medication, pulmonary aspiration, perforation of viscus, bleeding, recurrent infection, the need for additional procedures, failure to diagnose a condition, and creating a complication requiring transfusion or operation were discussed with the patient. The patient concurred with the proposed plan, giving informed consent.  The site of surgery properly noted/marked. The patient was taken to the operating room, identified as Kelly Perry and the procedure verified as ventral hernia repair. A Time Out was held and the above information confirmed.  The patient was placed supine.  After establishing general anesthesia, the abdomen was prepped with Chloraprep and draped in sterile fashion.  We made a vertical incision over the palpable hernia at the umbilicus. Dissection was carried down to the hernia sac located above the fascia and mobilized from surrounding structures.   The fascia was divided inferiorly and we discovered a second ventral hernia in the left lower quadrant just adjacent to the midline incision. We spent some time taking down adhesions that were adherent to the abdominal wall. Once we occluded the fascia we discovered additional hernia defects at the right-sided colostomy site and a left-sided mucous fistula site. We connected all of these defects into one large defect measuring about 25 cm in total length. Intact fascia was identified circumferentially around the defect.  We used a 25 x 33 cm  Ventrio ST mesh and secured this to the fascia with interrupted 1 Novofil sutures.  Secure strap tacks were placed between the fascial sutures.  The fascial defect was reapproximated with interrupted figure-of-8 1 Novofil sutures.  The subcutaneous tissues were irrigated.  Two 19 French drains were placed in the subcutaneous tissues. The skin incision was closed with staples. Dry dressing was applied    Instrument, sponge, and needle counts were correct prior to closure and at the conclusion of the case.   Findings: Four total fascial defects - cumulative length 25 cm  Estimated Blood Loss:  less than 100 mL         Drains: Two 19 Fr blake drains/ foley catheter                      Complications:  None; patient tolerated the procedure well.         Disposition: PACU - hemodynamically stable.         Condition: stable  [name]. [name], MD, [facility] Surgery  General/ Trauma Surgery  02/26/2013 12:56 PM

## 2013-02-26 NOTE — Preoperative (Signed)
Beta Blockers   Reason not to administer Beta Blockers:Not Applicable 

## 2013-02-26 NOTE — Anesthesia Postprocedure Evaluation (Signed)
  Anesthesia Post-op Note  Patient: Kelly Perry  Procedure(s) Performed: Procedure(s) (LRB): HERNIA REPAIR VENTRAL ADULT (N/A) INSERTION OF MESH (N/A) LYSIS OF ADHESION (N/A)  Patient Location: PACU  Anesthesia Type: General  Level of Consciousness: awake and alert   Airway and Oxygen Therapy: Patient Spontanous Breathing  Post-op Pain: mild  Post-op Assessment: Post-op Vital signs reviewed, Patient's Cardiovascular Status Stable, Respiratory Function Stable, Patent Airway and No signs of Nausea or vomiting  Last Vitals:  Filed Vitals:   02/26/13 1330  BP: 161/83  Pulse: 91  Temp:   Resp: 13    Post-op Vital Signs: stable   Complications: No apparent anesthesia complications

## 2013-02-27 ENCOUNTER — Encounter (HOSPITAL_COMMUNITY): Payer: Self-pay | Admitting: Surgery

## 2013-02-27 LAB — CBC
HCT: 41.9 % (ref 36.0–46.0)
Hemoglobin: 13.5 g/dL (ref 12.0–15.0)
MCH: 29 pg (ref 26.0–34.0)
MCV: 90.1 fL (ref 78.0–100.0)
RBC: 4.65 MIL/uL (ref 3.87–5.11)

## 2013-02-27 LAB — BASIC METABOLIC PANEL
CO2: 31 mEq/L (ref 19–32)
Chloride: 103 mEq/L (ref 96–112)
Creatinine, Ser: 0.85 mg/dL (ref 0.50–1.10)
Potassium: 3.7 mEq/L (ref 3.5–5.1)
Sodium: 139 mEq/L (ref 135–145)

## 2013-02-27 MED ORDER — KETOROLAC TROMETHAMINE 30 MG/ML IJ SOLN
30.0000 mg | Freq: Four times a day (QID) | INTRAMUSCULAR | Status: DC
  Filled 2013-02-27: qty 1

## 2013-02-27 MED ORDER — KETOROLAC TROMETHAMINE 30 MG/ML IJ SOLN
30.0000 mg | Freq: Four times a day (QID) | INTRAMUSCULAR | Status: AC
  Administered 2013-02-27 – 2013-03-01 (×7): 30 mg via INTRAVENOUS
  Filled 2013-02-27 (×8): qty 1

## 2013-02-27 NOTE — Care Management Note (Signed)
    Page 1 of 1   02/27/2013     11:57:54 AM   CARE MANAGEMENT NOTE 02/27/2013  Patient:  Kelly Perry,Kelly Perry   Account Number:  [id number]  Date Initiated:  02/27/2013  Documentation initiated by:  Lorenda Ishihara  Subjective/Objective Assessment:   62 yo female admitted s/p ventral hernia repair. PTA lived at home with spouse.     Action/Plan:   Home when stable   Anticipated DC Date:  03/02/2013   Anticipated DC Plan:  HOME/SELF CARE      DC Planning Services  CM consult      Choice offered to / List presented to:             Status of service:  Completed, signed off Medicare Important Message given?   (If response is "NO", the following Medicare IM given date fields will be blank) Date Medicare IM given:   Date Additional Medicare IM given:    Discharge Disposition:  HOME/SELF CARE  Per UR Regulation:  Reviewed for med. necessity/level of care/duration of stay  If discussed at Long Length of Stay Meetings, dates discussed:    Comments:

## 2013-02-27 NOTE — Progress Notes (Signed)
Spoke with pt in regards to CPAP ordered. Pt has brought in home unit CPAP machine. Checked cords for fraying then plugged into appropriate outlet. Filled humidification chamber, bled in 3L 02. Pt tolerating well. SpO2 94% vitals stable. RT will assist as needed.

## 2013-02-27 NOTE — Progress Notes (Signed)
Pt stated that she does not need any help with her home CPAP tonight, RT to monitor and assess as needed.

## 2013-02-27 NOTE — Progress Notes (Signed)
1 Day Post-Op  Subjective: Very sore; ambulating; no nausea   Objective: Vital signs in last 24 hours: Temp:  [97.3 F (36.3 C)-98.5 F (36.9 C)] 98.5 F (36.9 C) (06/24 0500) Pulse Rate:  [87-107] 107 (06/24 0500) Resp:  [10-16] 12 (06/24 0500) BP: (112-163)/(69-94) 129/79 mmHg (06/24 0500) SpO2:  [90 %-100 %] 90 % (06/24 0700) Weight:  [256 lb 3.2 oz (116.212 kg)] 256 lb 3.2 oz (116.212 kg) (06/23 1534) Last BM Date: 02/26/13  Intake/Output from previous day: 06/23 0701 - 06/24 0700 In: 3403.3 [I.V.:3403.3] Out: 1395 [Urine:850; Drains:445; Blood:100] Intake/Output this shift:    General appearance: alert, cooperative and no distress Resp: clear to auscultation bilaterally Cardio: regular rate and rhythm, S1, S2 normal, no murmur, click, rub or gallop GI: mildly distended; JP drains - bloody drainage Dressing dry  Lab Results:   Recent Labs  02/27/13 0358  WBC 10.8*  HGB 13.5  HCT 41.9  PLT 196   BMET  Recent Labs  02/27/13 0358  NA 139  K 3.7  CL 103  CO2 31  GLUCOSE 150*  BUN 10  CREATININE 0.85  CALCIUM 8.4   PT/INR No results found for this basename: LABPROT, INR,  in the last 72 hours ABG No results found for this basename: PHART, PCO2, PO2, HCO3,  in the last 72 hours  Studies/Results: No results found.  Anti-infectives: Anti-infectives   Start     Dose/Rate Route Frequency Ordered Stop   02/26/13 2200  vancomycin (VANCOCIN) IVPB 1000 mg/200 mL premix     1,000 mg 200 mL/hr over 60 Minutes Intravenous Every 12 hours 02/26/13 1340 02/27/13 2159   02/26/13 0600  vancomycin (VANCOCIN) 1,500 mg in sodium chloride 0.9 % 500 mL IVPB     1,500 mg 250 mL/hr over 120 Minutes Intravenous On call to O.R. 02/25/13 1535 02/26/13 0950      Assessment/Plan: s/p Procedure(s): HERNIA REPAIR VENTRAL ADULT (N/A) INSERTION OF MESH (N/A) LYSIS OF ADHESION (N/A) Ambulate D/C foley COntinue clears   LOS: 1 day    Loveta Dellis K. 02/27/2013

## 2013-02-28 LAB — CBC
HCT: 34.7 % — ABNORMAL LOW (ref 36.0–46.0)
Hemoglobin: 11.2 g/dL — ABNORMAL LOW (ref 12.0–15.0)
MCV: 89.2 fL (ref 78.0–100.0)
RBC: 3.89 MIL/uL (ref 3.87–5.11)
WBC: 9 10*3/uL (ref 4.0–10.5)

## 2013-02-28 LAB — BASIC METABOLIC PANEL
BUN: 5 mg/dL — ABNORMAL LOW (ref 6–23)
CO2: 32 mEq/L (ref 19–32)
Chloride: 101 mEq/L (ref 96–112)
Creatinine, Ser: 0.82 mg/dL (ref 0.50–1.10)
Glucose, Bld: 123 mg/dL — ABNORMAL HIGH (ref 70–99)
Potassium: 2.4 mEq/L — CL (ref 3.5–5.1)

## 2013-02-28 MED ORDER — POTASSIUM CHLORIDE 10 MEQ/100ML IV SOLN
10.0000 meq | INTRAVENOUS | Status: AC
  Administered 2013-02-28 (×3): 10 meq via INTRAVENOUS
  Filled 2013-02-28 (×3): qty 100

## 2013-02-28 MED ORDER — POTASSIUM CHLORIDE IN NACL 20-0.9 MEQ/L-% IV SOLN
INTRAVENOUS | Status: DC
  Administered 2013-02-28 (×2): via INTRAVENOUS
  Administered 2013-03-01: 10 mL via INTRAVENOUS
  Filled 2013-02-28 (×5): qty 1000

## 2013-02-28 MED ORDER — POTASSIUM CHLORIDE CRYS ER 10 MEQ PO TBCR
10.0000 meq | EXTENDED_RELEASE_TABLET | Freq: Two times a day (BID) | ORAL | Status: DC
  Administered 2013-02-28 – 2013-03-03 (×7): 10 meq via ORAL
  Filled 2013-02-28 (×8): qty 1

## 2013-02-28 NOTE — Progress Notes (Signed)
CRITICAL VALUE ALERT  Critical value received:  K+: 2.4  Date of notification:  02/28/13  Time of notification:  0526  Critical value read back:yes  Nurse who received alert:  Pilar Plate, RN  MD notified (1st page):  Daphine Deutscher, MD   Time of first page:  0528  MD notified (2nd page):  Time of second page:  Responding MD:  Daphine Deutscher, MD   Time MD responded: [phone]

## 2013-02-28 NOTE — Progress Notes (Signed)
Notified MD Bylerly regarding patient's blood pressure, orders to give ziac and hold other two blood pressure meds cozaar and flavocoxid Stanford Breed RN 02-28-2013 10:33am

## 2013-02-28 NOTE — Progress Notes (Signed)
2 Days Post-Op  Subjective: Feeling sore, but less pain than yesterday Voiding well Minimal flatus, mild nausea yesterday  Objective: Vital signs in last 24 hours: Temp:  [97.8 F (36.6 C)-99.1 F (37.3 C)] 97.8 F (36.6 C) (06/25 0235) Pulse Rate:  [88-109] 88 (06/25 0235) Resp:  [16-21] 16 (06/25 0340) BP: (103-138)/(63-84) 103/63 mmHg (06/25 0235) SpO2:  [90 %-100 %] 95 % (06/25 0340) Last BM Date: 02/26/13  Intake/Output from previous day: 06/24 0701 - 06/25 0700 In: 3491.7 [P.O.:1120; I.V.:1971.7; IV Piggyback:400] Out: 2800 [Urine:2450; Drains:350] Intake/Output this shift: Total I/O In: 1310 [P.O.:480; I.V.:830] Out: 2020 [Urine:1900; Drains:120]  General appearance: alert, cooperative and no distress Resp: clear to auscultation bilaterally GI: hypoactive bowel sounds; mildly distended Incision c/d/i; drain with some serosanguinous drainage  Lab Results:   Recent Labs  02/27/13 0358 02/28/13 0402  WBC 10.8* 9.0  HGB 13.5 11.2*  HCT 41.9 34.7*  PLT 196 187   BMET  Recent Labs  02/27/13 0358 02/28/13 0402  NA 139 136  K 3.7 2.4*  CL 103 101  CO2 31 32  GLUCOSE 150* 123*  BUN 10 5*  CREATININE 0.85 0.82  CALCIUM 8.4 8.2*   PT/INR No results found for this basename: LABPROT, INR,  in the last 72 hours ABG No results found for this basename: PHART, PCO2, PO2, HCO3,  in the last 72 hours  Studies/Results: No results found.  Anti-infectives: Anti-infectives   Start     Dose/Rate Route Frequency Ordered Stop   02/26/13 2200  vancomycin (VANCOCIN) IVPB 1000 mg/200 mL premix     1,000 mg 200 mL/hr over 60 Minutes Intravenous Every 12 hours 02/26/13 1340 02/27/13 1150   02/26/13 0600  vancomycin (VANCOCIN) 1,500 mg in sodium chloride 0.9 % 500 mL IVPB     1,500 mg 250 mL/hr over 120 Minutes Intravenous On call to O.R. 02/25/13 1535 02/26/13 0950      Assessment/Plan: s/p Procedure(s): HERNIA REPAIR VENTRAL ADULT (N/A) INSERTION OF MESH  (N/A) LYSIS OF ADHESION (N/A) Hypokalemia - chronic  Replete K - IV and PO Full liquids at the patient's request Ambulate   LOS: 2 days    Icelynn Onken K. 02/28/2013

## 2013-02-28 NOTE — Progress Notes (Signed)
Pt states that she will self administer her home CPAP, RT to monitor and assess as needed.

## 2013-03-01 MED ORDER — OXYCODONE-ACETAMINOPHEN 5-325 MG PO TABS
1.0000 | ORAL_TABLET | ORAL | Status: DC | PRN
  Administered 2013-03-01 – 2013-03-03 (×6): 2 via ORAL
  Filled 2013-03-01 (×6): qty 2

## 2013-03-01 MED ORDER — POTASSIUM CHLORIDE 10 MEQ/100ML IV SOLN
10.0000 meq | Freq: Once | INTRAVENOUS | Status: DC
  Filled 2013-03-01: qty 100

## 2013-03-01 MED ORDER — POTASSIUM CHLORIDE 10 MEQ/100ML IV SOLN
10.0000 meq | INTRAVENOUS | Status: AC
  Administered 2013-03-01 (×3): 10 meq via INTRAVENOUS
  Filled 2013-03-01 (×3): qty 100

## 2013-03-01 MED ORDER — HYDROMORPHONE HCL PF 1 MG/ML IJ SOLN
1.0000 mg | INTRAMUSCULAR | Status: DC | PRN
  Administered 2013-03-01 (×3): 1 mg via INTRAVENOUS
  Filled 2013-03-01 (×3): qty 1

## 2013-03-01 MED ORDER — SUCRALFATE 1 G PO TABS
1.0000 g | ORAL_TABLET | Freq: Three times a day (TID) | ORAL | Status: DC
  Administered 2013-03-01 – 2013-03-03 (×9): 1 g via ORAL
  Filled 2013-03-01 (×13): qty 1

## 2013-03-01 NOTE — Progress Notes (Signed)
3 Days Post-Op  Subjective: + BM Less sore Complains of some heartburn Drains - thinner serosanguinous output  Objective: Vital signs in last 24 hours: Temp:  [97.3 F (36.3 C)-98.5 F (36.9 C)] 98.5 F (36.9 C) (06/26 0547) Pulse Rate:  [84-99] 87 (06/26 0547) Resp:  [17-18] 18 (06/26 0547) BP: (100-108)/(63-68) 100/66 mmHg (06/26 0547) SpO2:  [93 %-100 %] 94 % (06/26 0547) FiO2 (%):  [46 %] 46 % (06/25 2027) Last BM Date: 02/26/13  02/28/13   Intake/Output from previous day: 06/25 0701 - 06/26 0700 In: 3411.7 [P.O.:1070; I.V.:2341.7] Out: 3436 [Urine:3100; Drains:335; Stool:1] Intake/Output this shift: Total I/O In: 1560 [P.O.:360; I.V.:1200] Out: 1806 [Urine:1650; Drains:155; Stool:1]  General appearance: alert, cooperative and no distress GI: soft, non-distended; + BS Incision c/d/i Drain - right drain not holding suction; slight leak around skin incision - vaseline gauze dressing Serosanguinous outpu Lab Results:   Recent Labs  02/27/13 0358 02/28/13 0402  WBC 10.8* 9.0  HGB 13.5 11.2*  HCT 41.9 34.7*  PLT 196 187   BMET  Recent Labs  02/27/13 0358 02/28/13 0402 03/01/13 0424  NA 139 136  --   K 3.7 2.4* 2.9*  CL 103 101  --   CO2 31 32  --   GLUCOSE 150* 123*  --   BUN 10 5*  --   CREATININE 0.85 0.82  --   CALCIUM 8.4 8.2*  --    PT/INR No results found for this basename: LABPROT, INR,  in the last 72 hours ABG No results found for this basename: PHART, PCO2, PO2, HCO3,  in the last 72 hours  Studies/Results: No results found.  Anti-infectives: Anti-infectives   Start     Dose/Rate Route Frequency Ordered Stop   02/26/13 2200  vancomycin (VANCOCIN) IVPB 1000 mg/200 mL premix     1,000 mg 200 mL/hr over 60 Minutes Intravenous Every 12 hours 02/26/13 1340 02/27/13 1150   02/26/13 0600  vancomycin (VANCOCIN) 1,500 mg in sodium chloride 0.9 % 500 mL IVPB     1,500 mg 250 mL/hr over 120 Minutes Intravenous On call to O.R. 02/25/13 1535  02/26/13 0950      Assessment/Plan: s/p Procedure(s): HERNIA REPAIR VENTRAL ADULT (N/A) INSERTION OF MESH (N/A) LYSIS OF ADHESION (N/A) Hypokalemia - slightly improved; more K runs today Heartburn - on Protonix; add Carafate D/C PCA pump - Percocet with back-up IV dilaudid KVO iVF Advance diet Possibly home tomorrow   LOS: 3 days    Kelly Perry K. 03/01/2013

## 2013-03-02 NOTE — Progress Notes (Signed)
4 Days Post-Op  Subjective: Two more bowel movement yesterday; some diarrhea today Tolerating regular diet Using PO pain meds Mildly nauseated  Objective: Vital signs in last 24 hours: Temp:  [98 F (36.7 C)-98.5 F (36.9 C)] 98 F (36.7 C) (06/27 0630) Pulse Rate:  [92-103] 92 (06/27 0630) Resp:  [18] 18 (06/27 0630) BP: (98-126)/(72-76) 98/72 mmHg (06/27 0630) SpO2:  [93 %-98 %] 93 % (06/27 0630) Last BM Date: 03/01/13  Intake/Output from previous day: 06/26 0701 - 06/27 0700 In: 1050 [P.O.:720; I.V.:330] Out: 3485 [Urine:3250; Drains:235] Intake/Output this shift: Total I/O In: 30 [I.V.:30] Out: 500 [Urine:500]  General appearance: alert, cooperative and no distress Resp: clear to auscultation bilaterally GI: soft, non-distended; incisional tenderness Midline incision c/d/i; drains - serosanguinous   Lab Results:   Recent Labs  02/28/13 0402  WBC 9.0  HGB 11.2*  HCT 34.7*  PLT 187   BMET  Recent Labs  02/28/13 0402 03/01/13 0424  NA 136  --   K 2.4* 2.9*  CL 101  --   CO2 32  --   GLUCOSE 123*  --   BUN 5*  --   CREATININE 0.82  --   CALCIUM 8.2*  --    PT/INR No results found for this basename: LABPROT, INR,  in the last 72 hours ABG No results found for this basename: PHART, PCO2, PO2, HCO3,  in the last 72 hours  Studies/Results: No results found.  Anti-infectives: Anti-infectives   Start     Dose/Rate Route Frequency Ordered Stop   02/26/13 2200  vancomycin (VANCOCIN) IVPB 1000 mg/200 mL premix     1,000 mg 200 mL/hr over 60 Minutes Intravenous Every 12 hours 02/26/13 1340 02/27/13 1150   02/26/13 0600  vancomycin (VANCOCIN) 1,500 mg in sodium chloride 0.9 % 500 mL IVPB     1,500 mg 250 mL/hr over 120 Minutes Intravenous On call to O.R. 02/25/13 1535 02/26/13 0950      Assessment/Plan: s/p Procedure(s): HERNIA REPAIR VENTRAL ADULT (N/A) INSERTION OF MESH (N/A) LYSIS OF ADHESION (N/A) Plan for discharge tomorrow Saline lock  IV Staples/ drain removal in office next week.   LOS: 4 days    Kelly Perry K. 03/02/2013

## 2013-03-03 MED ORDER — ONDANSETRON HCL 4 MG PO TABS: 4.0000 mg | ORAL_TABLET | Freq: Four times a day (QID) | ORAL | Status: DC | PRN

## 2013-03-03 MED ORDER — SUCRALFATE 1 G PO TABS: 1.0000 g | ORAL_TABLET | Freq: Three times a day (TID) | ORAL | Status: DC

## 2013-03-03 MED ORDER — OXYCODONE-ACETAMINOPHEN 5-325 MG PO TABS: 1.0000 | ORAL_TABLET | ORAL | Status: DC | PRN

## 2013-03-03 NOTE — Progress Notes (Signed)
Patient ID: Kelly Perry, female   DOB: Mar 03, 1951, 62 y.o.   MRN: [id number]  General Surgery - [facility] - Progress Note  POD# 5  Subjective: Patient doing well.  Family at bedside.  Anxious to go home today.  Tolerating diet.  Ambulating with assistance.  Objective: Vital signs in last 24 hours: Temp:  [98.3 F (36.8 C)-98.8 F (37.1 C)] 98.6 F (37 C) (06/28 1308) Pulse Rate:  [85-92] 85 (06/28 0633) Resp:  [20-21] 21 (06/28 0633) BP: (133-146)/(60-87) 146/87 mmHg (06/28 0633) SpO2:  [96 %-97 %] 97 % (06/28 6578) Last BM Date: 03/01/13  Intake/Output from previous day: 06/27 0701 - 06/28 0700 In: 413 [P.O.:360; I.V.:53] Out: 3232 [Urine:3100; Drains:130; Stool:2]  Exam: HEENT - clear, not icteric Neck - soft Chest - clear bilaterally Cor - RRR, no murmur Abd - midline wound clear and dry and intact; staples in place; drains with thin serosanguinous; binder on Ext - no significant edema Neuro - grossly intact, no focal deficits  Lab Results:  No results found for this basename: WBC, HGB, HCT, PLT,  in the last 72 hours   Recent Labs  03/01/13 0424  K 2.9*    Studies/Results: No results found.  Assessment / Plan: 1.  Status post open ventral hernia repair with mesh  Drain care instruction provided  Wear abdominal binder all times  Discharge home today  Follow up at [facility] office 5 days for wound check and possible drain removal  Rx's for Percocet, Carafate, Zofran at patient's request  Velora Heckler, MD, [facility] Office: [phone]  03/03/2013

## 2013-03-03 NOTE — Progress Notes (Signed)
Patient discharged per MD order. Instructions reviewed with patient at bedside. All questions answered. Patient wheeled to the car by nurse tech for transport home. Angelena Form, RN

## 2013-03-05 NOTE — Discharge Summary (Signed)
Physician Discharge Summary  Patient ID: Kelly Perry MRN: [id number] DOB/AGE: Jun 01, 1951 62 y.o.  Admit date: 02/26/2013 Discharge date: 03/03/13   Admission Diagnoses:Recurrent ventral hernias  Discharge Diagnoses: same Active Problems:   * No active hospital problems. *   Discharged Condition: good  Hospital Course: Open repair of multiple recurrent ventral incisional hernias on 6/23.  Post-op ileus for several days, slowly resolved.  Pain controlled initially with PCA, then switched to PO pain meds.  Hypokalemia treated, chronic problem.  Discharging with drains in place in subcutaneous tissues  Consults: None  Significant Diagnostic Studies: none  Treatments: surgery: Open ventral hernia repair with mesh  Discharge Exam: Blood pressure 146/87, pulse 85, temperature 98.6 F (37 C), temperature source Oral, resp. rate 21, height 5' 4.25" (1.632 m), weight 256 lb 3.2 oz (116.212 kg), SpO2 97.00%. General appearance: alert, cooperative and no distress Incision c/d/i Drains - serosanguinous drainage   Disposition: 01-Home or Self Care  Discharge Orders   Future Appointments Provider Department Dept Phone   03/08/2013 4:00 PM [name]. [name], MD [facility], [place] [phone]   Future Orders Complete By Expires     Change dressing (specify)  As directed     Comments:      Dressing change: once daily and as needed.  Drain care as instructed.    Diet - low sodium heart healthy  As directed     Discharge instructions  As directed     Comments:      [facility]  HERNIA REPAIR POST OP INSTRUCTIONS  Always review your discharge instruction sheet given to you by the facility where your surgery was performed.  A  prescription for pain medication may be given to you upon discharge.  Take your pain medication as prescribed.  If narcotic pain medicine is not needed, then you may take acetaminophen (Tylenol) or ibuprofen (Advil) as needed.  Take  your usually prescribed medications unless otherwise directed.  If you need a refill on your pain medication, please contact your pharmacy.  They will contact our office to request authorization. Prescriptions will not be filled after 5 pm daily or on weekends.  You should follow a light diet the first 24 hours after arrival home, such as soup and crackers or toast.  Be sure to include plenty of fluids daily.  Resume your normal diet the day after surgery.  Most patients will experience some swelling and bruising around the surgical site.  Ice packs and reclining will help.  Swelling and bruising can take several days to resolve.   It is common to experience some constipation if taking pain medication after surgery.  Increasing fluid intake and taking a stool softener (such as Colace) will usually help or prevent this problem from occurring.  A mild laxative (Milk of Magnesia or Miralax) should be taken according to package directions if there are no bowel movements after 48 hours.  Unless discharge instructions indicate otherwise, you may remove your bandages 24-48 hours after surgery, and you may shower at that time.  You may have steri-strips (small skin tapes) in place directly over the incision.  These strips should be left on the skin for 7-10 days.  If your surgeon used skin glue on the incision, you may shower in 24 hours.  The glue will flake off over the next 2-3 weeks.  Any sutures or staples will be removed at the office during your follow-up visit.  ACTIVITIES:  You may resume regular (light) daily  activities beginning the next day-such as daily self-care, walking, climbing stairs-gradually increasing activities as tolerated.  You may have sexual intercourse when it is comfortable.  Refrain from any heavy lifting or straining until approved by your doctor.  You may drive when you are no longer taking prescription pain medication, you can comfortably wear a seatbelt, and you can safely  maneuver your car and apply brakes.  You should see your doctor in the office for a follow-up appointment approximately 2-3 weeks after your surgery.  Make sure that you call for this appointment within a day or two after you arrive home to insure a convenient appointment time.   WHEN TO CALL YOUR DOCTOR: Fever greater than 101.0 Inability to urinate Persistent nausea and/or vomiting Extreme swelling or bruising Continued bleeding from incision Increased pain, redness, or drainage from the incision  The clinic staff is available to answer your questions during regular business hours.  Please don't hesitate to call and ask to speak to one of the nurses for clinical concerns.  If you have a medical emergency, go to the nearest emergency room or call 911.  A surgeon from [facility] is always on call for the hospital.   [facility] [address], [place], [place]  [address]  [phone] ? [phone] ? FAX [phone]  [email or web address]    Increase activity slowly  As directed         Medication List         bisoprolol-hydrochlorothiazide 2.5-6.25 MG per tablet  Commonly known as:  ZIAC  Take 1 tablet by mouth every morning.     DULoxetine 60 MG capsule  Commonly known as:  CYMBALTA  Take 60 mg by mouth every evening.     furosemide 20 MG tablet  Commonly known as:  LASIX  Take 20 mg by mouth 2 (two) times daily.     LIMBREL 500 MG Caps  Generic drug:  Flavocoxid  Take 500 mg by mouth 2 (two) times daily.     losartan 25 MG tablet  Commonly known as:  COZAAR  Take 25 mg by mouth 2 (two) times daily.     metaxalone 800 MG tablet  Commonly known as:  SKELAXIN  Take 800 mg by mouth 2 (two) times daily.     NEXIUM 40 MG capsule  Generic drug:  esomeprazole  Take 40 mg by mouth daily before breakfast.     ondansetron 4 MG tablet  Commonly known as:  ZOFRAN  Take 1 tablet (4 mg total) by mouth  every 6 (six) hours as needed for nausea.     OVER THE COUNTER MEDICATION  Apply 1 application topically every 6 (six) hours as needed. toprisin topical cream for pain     oxyCODONE-acetaminophen 5-325 MG per tablet  Commonly known as:  PERCOCET/ROXICET  Take 1-2 tablets by mouth every 4 (four) hours as needed for pain.     potassium chloride 10 MEQ tablet  Commonly known as:  K-DUR  Take 10 mEq by mouth 2 (two) times daily.     sucralfate 1 G tablet  Commonly known as:  CARAFATE  Take 1 tablet (1 g total) by mouth 4 (four) times daily -  with meals and at bedtime.     Vitamin D3 2000 UNITS Tabs  Take 1 tablet by mouth daily.         Signed: Hargun Spurling K. 03/05/2013, 11:14 AM

## 2013-03-06 NOTE — Progress Notes (Signed)
Discharge summary sent to payer through [facility]  

## 2013-03-07 ENCOUNTER — Telehealth (INDEPENDENT_AMBULATORY_CARE_PROVIDER_SITE_OTHER): Payer: Self-pay | Admitting: General Surgery

## 2013-03-07 MED ORDER — HYDROCODONE-ACETAMINOPHEN 5-325 MG PO TABS: 1.0000 | ORAL_TABLET | Freq: Four times a day (QID) | ORAL | Status: DC | PRN

## 2013-03-07 NOTE — Telephone Encounter (Signed)
Patient calling status post hernia repair on 02/26/2013. She states she will run out of oxycodone 5/325 tomorrow. Per automatic refill protocol Norco 5/325 #30 called into [facility] pharmacy in [place]. Patient will call with any other questions.

## 2013-03-08 ENCOUNTER — Encounter (INDEPENDENT_AMBULATORY_CARE_PROVIDER_SITE_OTHER): Payer: PRIVATE HEALTH INSURANCE | Admitting: Surgery

## 2013-03-08 ENCOUNTER — Encounter (INDEPENDENT_AMBULATORY_CARE_PROVIDER_SITE_OTHER): Payer: Self-pay | Admitting: General Surgery

## 2013-03-08 ENCOUNTER — Ambulatory Visit (INDEPENDENT_AMBULATORY_CARE_PROVIDER_SITE_OTHER): Payer: PRIVATE HEALTH INSURANCE | Admitting: General Surgery

## 2013-03-08 VITALS — BP 127/72 | HR 76 | Temp 98.2°F | Resp 16 | Ht 64.0 in | Wt 254.2 lb

## 2013-03-08 DIAGNOSIS — K432 Incisional hernia without obstruction or gangrene: Secondary | ICD-10-CM

## 2013-03-08 NOTE — Progress Notes (Signed)
Subjective:     Patient ID: Kelly Perry, female   DOB: 1951-02-17, 62 y.o.   MRN: [id number]  HPI Patient status post open hernia repair. She comes for postop visit in the urgent clinic today to see if her drains may be removed. She is feeling quite well. She is decreasing the amount of pain medication she is taking. Her drains have not put out any significant fluid for the past 48 hours. She is eating and moving her bowels. She is wearing her abdominal binder.  Review of Systems     Objective:   Physical Exam Abdomen soft. Midline incision is intact with staples but they appear to need to stay in for another few days. There is no evidence of infection. Both drains were removed without difficulty. There was mild serosanguineous drainage from each drain site. Gauze dressings were applied and her binder was replaced.    Assessment:     Progressing well status post open ventral hernia repair with mesh    Plan:     Both drains were removed as above. We will have her see Dr.Tsuei Monday, July 7 for staple removal. She had questions about driving and I advised her that she will need to be both a pain medicine and comfortable operating her car very well prior to being able to drive. I feel she'll likely be able to do this next week.

## 2013-03-08 NOTE — Patient Instructions (Signed)
See Dr. [name] Monday at 9AM. Change cause of her drain sites at least daily. You may shower with dressings off. Replace afterwards.

## 2013-03-12 ENCOUNTER — Encounter (INDEPENDENT_AMBULATORY_CARE_PROVIDER_SITE_OTHER): Payer: Self-pay | Admitting: Surgery

## 2013-03-12 ENCOUNTER — Ambulatory Visit (INDEPENDENT_AMBULATORY_CARE_PROVIDER_SITE_OTHER): Payer: PRIVATE HEALTH INSURANCE | Admitting: Surgery

## 2013-03-12 VITALS — BP 120/80 | HR 78 | Temp 98.4°F | Resp 16 | Ht 64.0 in | Wt 252.2 lb

## 2013-03-12 DIAGNOSIS — K432 Incisional hernia without obstruction or gangrene: Secondary | ICD-10-CM

## 2013-03-12 MED ORDER — HYDROCODONE-ACETAMINOPHEN 5-325 MG PO TABS: 1.0000 | ORAL_TABLET | Freq: Four times a day (QID) | ORAL | Status: DC | PRN

## 2013-03-12 NOTE — Progress Notes (Signed)
Status post open ventral hernia repair with mesh on 02/26/13. The patient is doing quite well. Her drains were removed last week. Her staple line is healing well with no sign of infection or seroma. The staples are removed and replaced with Steri-Strips. No sign of recurrent ventral hernia. She has some tenderness laterally from her stay sutures but this is to be expected. We refilled her pain medication. She should continue limiting her level of activity. Followup in 3-4 weeks.  [name]. [name], MD, [facility]  General/ Trauma Surgery  03/12/2013 11:41 AM

## 2013-03-15 ENCOUNTER — Telehealth (INDEPENDENT_AMBULATORY_CARE_PROVIDER_SITE_OTHER): Payer: Self-pay | Admitting: *Deleted

## 2013-03-15 NOTE — Telephone Encounter (Signed)
Patient states that last night she started feeling a wet spot on her night gown.  Patient reports the drainage to be a dark red color mixed with a clear looking fluid.  Explained to patient that it sounds like this is normal and is just a pocket of blood that was under the incision site but is now draining out.  Instructed patient on signs/symptoms of infection and instructed her to call back if she see's any change in the color or if it continues for an extended period of time.  Patient states understanding and agreeable at this time.

## 2013-03-19 ENCOUNTER — Encounter (INDEPENDENT_AMBULATORY_CARE_PROVIDER_SITE_OTHER): Payer: PRIVATE HEALTH INSURANCE | Admitting: Surgery

## 2013-04-03 ENCOUNTER — Encounter (INDEPENDENT_AMBULATORY_CARE_PROVIDER_SITE_OTHER): Payer: PRIVATE HEALTH INSURANCE | Admitting: Surgery

## 2013-04-09 ENCOUNTER — Telehealth (INDEPENDENT_AMBULATORY_CARE_PROVIDER_SITE_OTHER): Payer: Self-pay

## 2013-04-09 NOTE — Telephone Encounter (Signed)
Patient states she is still having yellowish drainage below incision site, denies temp,rednes,odor; Does she need to be scheduled with Dr. [name] ?

## 2013-04-09 NOTE — Telephone Encounter (Signed)
Called patient back and told her that she needs to see one of Dr [name] partners. I put her on Dr Jamey Ripa on 04-10-13 in the urgent office. Patient agree

## 2013-04-10 ENCOUNTER — Encounter (INDEPENDENT_AMBULATORY_CARE_PROVIDER_SITE_OTHER): Payer: Self-pay | Admitting: Surgery

## 2013-04-10 ENCOUNTER — Ambulatory Visit (INDEPENDENT_AMBULATORY_CARE_PROVIDER_SITE_OTHER): Payer: Commercial Indemnity | Admitting: Surgery

## 2013-04-10 VITALS — BP 138/74 | HR 72 | Temp 98.7°F | Resp 16 | Ht 64.0 in | Wt 253.0 lb

## 2013-04-10 DIAGNOSIS — IMO0002 Reserved for concepts with insufficient information to code with codable children: Secondary | ICD-10-CM

## 2013-04-10 NOTE — Patient Instructions (Signed)
Keep steril gauze covering the spot on the incision that is draining. Call to let me know how you are doing on Thursday

## 2013-04-10 NOTE — Progress Notes (Signed)
NAME: Delisha Detzel       DOB: 1951/03/21           DATE: 04/10/2013       WUJ:[id number]  CC:  Chief Complaint  Patient presents with  . Follow-up    reck wound/drainage    HPI: She is over a month from repair of a large ventral hernia. She has been doing well, but the last several days has had increasing yellow drainage from a small area in her wound, NO fevers or chills, otherwise OK  EXAM: Vital signs: BP 138/74  Pulse 72  Temp(Src) 98.7 F (37.1 C) (Temporal)  Resp 16  Ht 5\' 4"  (1.626 m)  Wt 253 lb (114.76 kg)  BMI 43.41 kg/m2  General: Patient alert, oriented, NAD  Abd is soft and benign, Wound without erythema, not tender. There is a small depression in about the middle of the incsion draining serous material. I probed it with a sterile cotton tipped applicator and more fluid drained but feels failry deep and the opening too small to accept a wick IMP: Seroma, draining not infected  PLAN: Continue to keep covered with sterile gauze. Hopefully most of fluuid is drained with my manipulation today. Nevertheless, will start on doxy and have her check with me later this week  Kamani Lewter J 04/10/2013

## 2013-04-11 ENCOUNTER — Other Ambulatory Visit (INDEPENDENT_AMBULATORY_CARE_PROVIDER_SITE_OTHER): Payer: Self-pay | Admitting: *Deleted

## 2013-04-11 ENCOUNTER — Telehealth (INDEPENDENT_AMBULATORY_CARE_PROVIDER_SITE_OTHER): Payer: Self-pay | Admitting: *Deleted

## 2013-04-11 MED ORDER — DOXYCYCLINE HYCLATE 50 MG PO CAPS: 100.0000 mg | ORAL_CAPSULE | Freq: Two times a day (BID) | ORAL | Status: DC

## 2013-04-11 NOTE — Telephone Encounter (Signed)
Patient called this morning to report that when she called her pharmacy they had not received the prescription for Doxycycline from yesterday.  This RN paged Dr. Jamey Ripa to get instructions and then prescription was escribed to patients pharmacy.  Patient aware at this time.

## 2013-04-12 ENCOUNTER — Telehealth (INDEPENDENT_AMBULATORY_CARE_PROVIDER_SITE_OTHER): Payer: Self-pay

## 2013-04-12 NOTE — Telephone Encounter (Signed)
Pt called per Dr Tenna Child request with update on wound. Pt states wd drained clear serous fluid yesterday. Pt states only small amount of drainage today. Drainage still clear without order. Pt states she feels better now that area drained. No rednes. No fever. Pt will keep appt next week with Dr [name]. Pt will call if drainage changes,becomes red or hot to touch.

## 2013-04-18 ENCOUNTER — Ambulatory Visit (INDEPENDENT_AMBULATORY_CARE_PROVIDER_SITE_OTHER): Payer: Commercial Indemnity | Admitting: Surgery

## 2013-04-18 ENCOUNTER — Encounter (INDEPENDENT_AMBULATORY_CARE_PROVIDER_SITE_OTHER): Payer: Self-pay | Admitting: Surgery

## 2013-04-18 VITALS — BP 118/76 | HR 72 | Temp 97.4°F | Resp 15 | Ht 64.25 in | Wt 254.4 lb

## 2013-04-18 DIAGNOSIS — K432 Incisional hernia without obstruction or gangrene: Secondary | ICD-10-CM

## 2013-04-18 NOTE — Progress Notes (Signed)
The patient continues to have a small amount of yellow seroma drainage through a small pinhole in her midline incision. The incision itself otherwise looks good with no sign of infection. No sign of recurrent hernia. The drainage is minimal. Overall she is feeling better. This should seal on its own. Continue with Neosporin and a dry dressing until healed. Followup in 2 weeks for recheck.  [name]. [name], MD, [facility]  General/ Trauma Surgery  04/18/2013 3:07 PM\

## 2013-04-30 ENCOUNTER — Encounter (INDEPENDENT_AMBULATORY_CARE_PROVIDER_SITE_OTHER): Payer: PRIVATE HEALTH INSURANCE | Admitting: Surgery

## 2013-05-08 ENCOUNTER — Ambulatory Visit (INDEPENDENT_AMBULATORY_CARE_PROVIDER_SITE_OTHER): Payer: Commercial Indemnity | Admitting: Surgery

## 2013-05-08 ENCOUNTER — Encounter (INDEPENDENT_AMBULATORY_CARE_PROVIDER_SITE_OTHER): Payer: Self-pay | Admitting: Surgery

## 2013-05-08 VITALS — BP 126/82 | HR 84 | Temp 97.7°F | Resp 14 | Ht 64.2 in | Wt 257.0 lb

## 2013-05-08 DIAGNOSIS — K432 Incisional hernia without obstruction or gangrene: Secondary | ICD-10-CM

## 2013-05-08 NOTE — Progress Notes (Signed)
Status post open ventral hernia repair with mesh on 02/26/13. The patient is doing quite well. Soreness is improving. She still has a pinhole opening that occasionally drains just a tiny amount of seroma fluid. This opening is less than a millimeter across. There is no surrounding erythema or induration. I was unable to express any drainage today. We prepped this area with alcohol and then sealed the whole with Dermabond. Hopefully this will give the skin a chance to heal completely. We'll see her back as needed. She may resume full activity.  [name]. [name], MD, [facility] Surgery  General/ Trauma Surgery  05/08/2013 2:48 PM

## 2013-06-04 ENCOUNTER — Encounter (INDEPENDENT_AMBULATORY_CARE_PROVIDER_SITE_OTHER): Payer: Self-pay

## 2013-06-05 ENCOUNTER — Encounter (INDEPENDENT_AMBULATORY_CARE_PROVIDER_SITE_OTHER): Payer: Self-pay

## 2013-11-27 ENCOUNTER — Ambulatory Visit: Payer: Self-pay

## 2013-11-30 ENCOUNTER — Ambulatory Visit: Payer: Self-pay

## 2014-02-13 DIAGNOSIS — M5136 Other intervertebral disc degeneration, lumbar region: Secondary | ICD-10-CM | POA: Insufficient documentation

## 2014-02-13 DIAGNOSIS — M5416 Radiculopathy, lumbar region: Secondary | ICD-10-CM | POA: Insufficient documentation

## 2014-08-05 ENCOUNTER — Ambulatory Visit: Payer: Self-pay

## 2015-07-16 ENCOUNTER — Other Ambulatory Visit: Payer: Self-pay | Admitting: Internal Medicine

## 2015-07-16 DIAGNOSIS — G4733 Obstructive sleep apnea (adult) (pediatric): Secondary | ICD-10-CM | POA: Insufficient documentation

## 2015-07-16 DIAGNOSIS — Z9989 Dependence on other enabling machines and devices: Secondary | ICD-10-CM

## 2015-07-16 DIAGNOSIS — Z1231 Encounter for screening mammogram for malignant neoplasm of breast: Secondary | ICD-10-CM

## 2015-07-16 DIAGNOSIS — K219 Gastro-esophageal reflux disease without esophagitis: Secondary | ICD-10-CM | POA: Insufficient documentation

## 2015-07-16 DIAGNOSIS — I10 Essential (primary) hypertension: Secondary | ICD-10-CM | POA: Insufficient documentation

## 2015-08-04 ENCOUNTER — Ambulatory Visit: Payer: 59

## 2015-08-18 ENCOUNTER — Ambulatory Visit
Admission: RE | Admit: 2015-08-18 | Discharge: 2015-08-18 | Disposition: A | Discharge status: Home / Self Care | Payer: 59 | Source: Ambulatory Visit | Attending: Internal Medicine | Admitting: Internal Medicine

## 2015-08-18 DIAGNOSIS — Z1231 Encounter for screening mammogram for malignant neoplasm of breast: Secondary | ICD-10-CM | POA: Diagnosis not present

## 2016-08-16 DIAGNOSIS — D369 Benign neoplasm, unspecified site: Secondary | ICD-10-CM | POA: Insufficient documentation

## 2017-06-29 ENCOUNTER — Other Ambulatory Visit: Payer: Self-pay | Admitting: Internal Medicine

## 2017-06-29 DIAGNOSIS — Z1231 Encounter for screening mammogram for malignant neoplasm of breast: Secondary | ICD-10-CM

## 2017-08-03 DIAGNOSIS — Z6841 Body Mass Index (BMI) 40.0 and over, adult: Secondary | ICD-10-CM

## 2017-09-14 ENCOUNTER — Ambulatory Visit
Admission: RE | Admit: 2017-09-14 | Discharge: 2017-09-14 | Disposition: A | Discharge status: Home / Self Care | Payer: 59 | Source: Ambulatory Visit | Attending: Internal Medicine | Admitting: Internal Medicine

## 2017-09-14 DIAGNOSIS — Z1231 Encounter for screening mammogram for malignant neoplasm of breast: Secondary | ICD-10-CM | POA: Insufficient documentation

## 2017-10-31 DIAGNOSIS — I341 Nonrheumatic mitral (valve) prolapse: Secondary | ICD-10-CM | POA: Insufficient documentation

## 2018-02-13 ENCOUNTER — Ambulatory Visit
Admission: RE | Admit: 2018-02-13 | Discharge: 2018-02-13 | Disposition: A | Discharge status: Home / Self Care | Payer: 59 | Source: Ambulatory Visit | Attending: Gastroenterology | Admitting: Gastroenterology

## 2018-02-13 ENCOUNTER — Ambulatory Visit: Payer: 59 | Admitting: Anesthesiology

## 2018-02-13 ENCOUNTER — Encounter
Admission: RE | Disposition: A | Discharge status: Home / Self Care | Payer: Self-pay | Source: Ambulatory Visit | Attending: Gastroenterology

## 2018-02-13 DIAGNOSIS — Z882 Allergy status to sulfonamides status: Secondary | ICD-10-CM | POA: Insufficient documentation

## 2018-02-13 DIAGNOSIS — K219 Gastro-esophageal reflux disease without esophagitis: Secondary | ICD-10-CM | POA: Insufficient documentation

## 2018-02-13 DIAGNOSIS — I739 Peripheral vascular disease, unspecified: Secondary | ICD-10-CM | POA: Insufficient documentation

## 2018-02-13 DIAGNOSIS — Z86010 Personal history of colonic polyps: Secondary | ICD-10-CM | POA: Insufficient documentation

## 2018-02-13 DIAGNOSIS — Z980 Intestinal bypass and anastomosis status: Secondary | ICD-10-CM | POA: Diagnosis not present

## 2018-02-13 DIAGNOSIS — R7303 Prediabetes: Secondary | ICD-10-CM | POA: Diagnosis not present

## 2018-02-13 DIAGNOSIS — Z6841 Body Mass Index (BMI) 40.0 and over, adult: Secondary | ICD-10-CM | POA: Diagnosis not present

## 2018-02-13 DIAGNOSIS — Z79899 Other long term (current) drug therapy: Secondary | ICD-10-CM | POA: Insufficient documentation

## 2018-02-13 DIAGNOSIS — G4730 Sleep apnea, unspecified: Secondary | ICD-10-CM | POA: Insufficient documentation

## 2018-02-13 DIAGNOSIS — Z885 Allergy status to narcotic agent status: Secondary | ICD-10-CM | POA: Diagnosis not present

## 2018-02-13 DIAGNOSIS — I10 Essential (primary) hypertension: Secondary | ICD-10-CM | POA: Diagnosis not present

## 2018-02-13 DIAGNOSIS — Z91010 Allergy to peanuts: Secondary | ICD-10-CM | POA: Diagnosis not present

## 2018-02-13 DIAGNOSIS — Z884 Allergy status to anesthetic agent status: Secondary | ICD-10-CM | POA: Diagnosis not present

## 2018-02-13 DIAGNOSIS — D123 Benign neoplasm of transverse colon: Secondary | ICD-10-CM | POA: Diagnosis not present

## 2018-02-13 DIAGNOSIS — D128 Benign neoplasm of rectum: Secondary | ICD-10-CM | POA: Diagnosis not present

## 2018-02-13 DIAGNOSIS — Z86718 Personal history of other venous thrombosis and embolism: Secondary | ICD-10-CM | POA: Insufficient documentation

## 2018-02-13 DIAGNOSIS — Z87891 Personal history of nicotine dependence: Secondary | ICD-10-CM | POA: Insufficient documentation

## 2018-02-13 DIAGNOSIS — J45909 Unspecified asthma, uncomplicated: Secondary | ICD-10-CM | POA: Insufficient documentation

## 2018-02-13 DIAGNOSIS — Z1211 Encounter for screening for malignant neoplasm of colon: Secondary | ICD-10-CM | POA: Diagnosis not present

## 2018-02-13 DIAGNOSIS — Z888 Allergy status to other drugs, medicaments and biological substances status: Secondary | ICD-10-CM | POA: Diagnosis not present

## 2018-02-13 DIAGNOSIS — K529 Noninfective gastroenteritis and colitis, unspecified: Secondary | ICD-10-CM | POA: Insufficient documentation

## 2018-02-13 HISTORY — DX: Acute embolism and thrombosis of unspecified deep veins of unspecified lower extremity: I82.409

## 2018-02-13 HISTORY — DX: Benign neoplasm of colon, unspecified: D12.6

## 2018-02-13 HISTORY — DX: Other cervical disc displacement, unspecified cervical region: M50.20

## 2018-02-13 HISTORY — PX: COLONOSCOPY WITH PROPOFOL: SHX5780

## 2018-02-13 SURGERY — COLONOSCOPY WITH PROPOFOL
Anesthesia: General

## 2018-02-13 MED ORDER — PHENYLEPHRINE HCL 10 MG/ML IJ SOLN
INTRAMUSCULAR | Status: AC
  Filled 2018-02-13: qty 1

## 2018-02-13 MED ORDER — PROPOFOL 500 MG/50ML IV EMUL
INTRAVENOUS | Status: AC
  Filled 2018-02-13: qty 50

## 2018-02-13 MED ORDER — PROPOFOL 500 MG/50ML IV EMUL
INTRAVENOUS | Status: DC | PRN
  Administered 2018-02-13: 120 ug/kg/min via INTRAVENOUS

## 2018-02-13 MED ORDER — SODIUM CHLORIDE 0.9 % IV SOLN
INTRAVENOUS | Status: DC
  Administered 2018-02-13: 11:00:00 via INTRAVENOUS

## 2018-02-13 MED ORDER — SODIUM CHLORIDE 0.9 % IV SOLN
INTRAVENOUS | Status: DC
  Administered 2018-02-13: 1000 mL via INTRAVENOUS

## 2018-02-13 MED ORDER — PROPOFOL 10 MG/ML IV BOLUS
INTRAVENOUS | Status: AC
  Filled 2018-02-13: qty 20

## 2018-02-13 MED ORDER — PHENYLEPHRINE HCL 10 MG/ML IJ SOLN
INTRAMUSCULAR | Status: DC | PRN
  Administered 2018-02-13 (×4): 50 ug via INTRAVENOUS

## 2018-02-13 MED ORDER — MIDAZOLAM HCL 2 MG/2ML IJ SOLN
INTRAMUSCULAR | Status: DC | PRN
  Administered 2018-02-13: 2 mg via INTRAVENOUS

## 2018-02-13 MED ORDER — MIDAZOLAM HCL 2 MG/2ML IJ SOLN
INTRAMUSCULAR | Status: AC
  Filled 2018-02-13: qty 2

## 2018-02-13 MED ORDER — LIDOCAINE HCL (PF) 1 % IJ SOLN: 2.0000 mL | Freq: Once | INTRAMUSCULAR | Status: DC

## 2018-02-13 NOTE — Transfer of Care (Signed)
Immediate Anesthesia Transfer of Care Note  Patient: Kelly Perry  Procedure(s) Performed: COLONOSCOPY WITH PROPOFOL (N/A )  Patient Location: PACU  Anesthesia Type:General  Level of Consciousness: awake and sedated  Airway & Oxygen Therapy: Patient Spontanous Breathing and Patient connected to face mask oxygen  Post-op Assessment: Report given to RN and Post -op Vital signs reviewed and stable  Post vital signs: Reviewed and stable  Last Vitals:  Vitals Value Taken Time  BP    Temp    Pulse    Resp    SpO2      Last Pain:  Vitals:   02/13/18 1021  TempSrc: Tympanic  PainSc: 0-No pain         Complications: No apparent anesthesia complications

## 2018-02-13 NOTE — Anesthesia Postprocedure Evaluation (Signed)
Anesthesia Post Note  Patient: Kelly Perry  Procedure(s) Performed: COLONOSCOPY WITH PROPOFOL (N/A )  Patient location during evaluation: Endoscopy Anesthesia Type: General Level of consciousness: awake and alert Pain management: pain level controlled Vital Signs Assessment: post-procedure vital signs reviewed and stable Respiratory status: spontaneous breathing, nonlabored ventilation, respiratory function stable and patient connected to nasal cannula oxygen Cardiovascular status: blood pressure returned to baseline and stable Postop Assessment: no apparent nausea or vomiting Anesthetic complications: no     Last Vitals:  Vitals:   02/13/18 1213 02/13/18 1223  BP: 113/77 128/74  Pulse: 72 73  Resp: 17 15  Temp:    SpO2: 98% 96%    Last Pain:  Vitals:   02/13/18 1223  TempSrc:   PainSc: 0-No pain                 Martha Clan

## 2018-02-13 NOTE — Op Note (Signed)
[facility] Gastroenterology Patient Name: Kelly Perry Procedure Date: 02/13/2018 11:03 AM MRN: [id number] Account #: [id number] Date of Birth: 1951-01-07 Admit Type: Outpatient Age: 67 Room: [facility] ENDO ROOM 1 Gender: Female Note Status: Finalized Procedure:            Colonoscopy Indications:          Personal history of colonic polyps Providers:            Lollie Sails, MD Referring MD:         Rusty Aus, MD (Referring MD) Medicines:            Monitored Anesthesia Care Complications:        No immediate complications. Procedure:            Pre-Anesthesia Assessment:                       - ASA Grade Assessment: III - A patient with severe                        systemic disease.                       After obtaining informed consent, the colonoscope was                        passed under direct vision. Throughout the procedure,                        the patient's blood pressure, pulse, and oxygen                        saturations were monitored continuously. The                        Colonoscope was introduced through the anus and                        advanced to the the cecum, identified by appendiceal                        orifice and ileocecal valve. The colonoscopy was                        performed without difficulty. The patient tolerated the                        procedure well. The quality of the bowel preparation                        was good. Findings:      There was evidence of a prior end-to-end colo-colonic anastomosis at 52       cm proximal to the anus. This was patent and was characterized by       healthy appearing mucosa. The anastomosis was traversed.      Two sessile polyps were found in the proximal transverse colon. The       polyps were 3 to 4 mm in size. These polyps were removed with a cold       biopsy forceps. Resection and retrieval were complete.      prominant IC valve, otherwise normal in appearance,  biopsied, noted to       be lipomatous.      A 15-20 mm polypoid, verrucous appearing lesion was found in the distal       rectum about .5 cm above the anal verge in the posterior mildine. The       lesion was sessile and multi-lobulated. No bleeding was present. Two       passes of a cold snare removed some of the lesion for histology.      No additional abnormalities were found on retroflexion.      The digital rectal exam was otherwise normal. Impression:           - Patent end-to-end colo-colonic anastomosis,                        characterized by healthy appearing mucosa.                       - Two 3 to 4 mm polyps in the proximal transverse                        colon, removed with a cold biopsy forceps. Resected and                        retrieved.                       - Polypoid lesion in the distal rectum. Recommendation:       - Discharge patient to home.                       - Await pathology results.                       - Telephone GI clinic for pathology results in 1 week.                       - Full liquid diet today, then advance as tolerated to                        low residue diet for 3 days. Procedure Code(s):    --- Professional ---                       [phone], Colonoscopy, flexible; with biopsy, single or                        multiple Diagnosis Code(s):    --- Professional ---                       Z98.0, Intestinal bypass and anastomosis status                       D12.3, Benign neoplasm of transverse colon (hepatic                        flexure or splenic flexure)                       D49.0, Neoplasm of unspecified behavior of digestive  system                       Z86.010, Personal history of colonic polyps CPT copyright 2017 American Medical Association. All rights reserved. The codes documented in this report are preliminary and upon coder review may  be revised to meet current compliance requirements. Lollie Sails,  MD 02/13/2018 11:57:59 AM This report has been signed electronically. Number of Addenda: 0 Note Initiated On: 02/13/2018 11:03 AM Scope Withdrawal Time: 0 hours 16 minutes 25 seconds  Total Procedure Duration: 0 hours 29 minutes 0 seconds       [facility]

## 2018-02-13 NOTE — Anesthesia Post-op Follow-up Note (Signed)
Anesthesia QCDR form completed.        

## 2018-02-13 NOTE — H&P (Signed)
Outpatient short stay form Pre-procedure 02/13/2018 11:09 AM Lollie Sails MD  Primary Physician: Emily Filbert, MD  Reason for visit: Colonoscopy  History of present illness: Personal history of adenomatous colon polyps.  Patient is a 67 year old female presenting today as above.  Is also had diverticular surgery with ostomy and subsequent reversal.  Tolerating her prep well.  She takes no blood thinning agent however is on several herbal type of agents that do have thinning properties.  Held those as well.    Current Facility-Administered Medications:  .  0.9 %  sodium chloride infusion, , Intravenous, Continuous, Lollie Sails, MD .  0.9 %  sodium chloride infusion, , Intravenous, Continuous, Lollie Sails, MD, Last Rate: 20 mL/hr at 02/13/18 1049, 1,000 mL at 02/13/18 1049 .  lidocaine (PF) (XYLOCAINE) 1 % injection 2 mL, 2 mL, Intradermal, Once, Lollie Sails, MD  Medications Prior to Admission  Medication Sig Dispense Refill Last Dose  . Bioflavonoid Products (BIOFLEX PO) Take by mouth daily.     Marland Kitchen CALCIUM CARBONATE-VIT D-MIN PO Take by mouth daily.     . OMEGA-3 FATTY ACIDS PO Take by mouth daily.     Marland Kitchen telmisartan (MICARDIS) 40 MG tablet Take 40 mg by mouth daily.   02/13/2018 at 0600  . bisoprolol-hydrochlorothiazide (ZIAC) 2.5-6.25 MG per tablet Take 1 tablet by mouth every morning.    02/13/2018 at 0600  . Cholecalciferol (VITAMIN D3) 2000 UNITS TABS Take 1 tablet by mouth daily.    Taking  . DULoxetine (CYMBALTA) 60 MG capsule Take 60 mg by mouth every evening.    Taking  . Flavocoxid (LIMBREL) 500 MG CAPS Take 500 mg by mouth 2 (two) times daily.   Taking  . furosemide (LASIX) 20 MG tablet Take 20 mg by mouth 2 (two) times daily.    Taking  . losartan (COZAAR) 25 MG tablet Take 25 mg by mouth 2 (two) times daily.    Taking  . metaxalone (SKELAXIN) 800 MG tablet Take 800 mg by mouth 2 (two) times daily.    Taking  . NEXIUM 40 MG capsule Take 40 mg by mouth  daily before breakfast.    02/13/2018 at 0600  . OVER THE COUNTER MEDICATION Apply 1 application topically every 6 (six) hours as needed. toprisin topical cream for pain   Taking  . potassium chloride (K-DUR) 10 MEQ tablet Take 10 mEq by mouth 2 (two) times daily.    Taking  . sucralfate (CARAFATE) 1 G tablet Take 1 tablet (1 g total) by mouth 4 (four) times daily -  with meals and at bedtime. 40 tablet 1 Taking     Allergies  Allergen Reactions  . Morphine And Related     Elephant on chest  . Novocain [Procaine]     blackout  . Penicillins Rash  . Aspirin     Ringing in the ears  . Doxycycline Hyclate   . Levaquin [Levofloxacin In D5w]   . Sulfur     sweating  . Tape     USE PAPER TAPE     Past Medical History:  Diagnosis Date  . Arthritis   . Asthma    wheezing with URI- no inhalers day to day  . Cervical disc herniation   . Colon adenomas   . Deep vein thrombosis (DVT) during current hospitalization ([place])   . Easy bruising   . GERD (gastroesophageal reflux disease)   . Hypertension   . Mitral valve prolapse  eccho 7/13 chart  . Peripheral vascular disease ([place]) 7/12   DVT  left leg  post op- coumadin x 6 months  . Sleep apnea     Review of systems:      Physical Exam    Heart and lungs: Regular rate and rhythm without rub or gallop, lungs are bilaterally clear.    HEENT: Normocephalic atraumatic eyes are anicteric    Other:    Pertinant exam for procedure: Soft nontender nondistended bowel sounds positive normoactive, obese, multiple scars on the abdominal surface consistent with her surgical histories.    Planned proceedures: Colonoscopy and indicated procedures. I have discussed the risks benefits and complications of procedures to include not limited to bleeding, infection, perforation and the risk of sedation and the patient wishes to proceed.    Lollie Sails, MD Gastroenterology 02/13/2018  11:09 AM

## 2018-02-13 NOTE — Anesthesia Preprocedure Evaluation (Signed)
Anesthesia Evaluation  Patient identified by MRN, date of birth, ID band Patient awake    Reviewed: Allergy & Precautions, H&P , NPO status , Patient's Chart, lab work & pertinent test results, reviewed documented beta blocker date and time   Airway Mallampati: III  TM Distance: >3 FB Neck ROM: full    Dental  (+) Dental Advidsory Given, Teeth Intact   Pulmonary neg shortness of breath, asthma , sleep apnea and Continuous Positive Airway Pressure Ventilation , neg COPD, neg recent URI, former smoker,           Cardiovascular Exercise Tolerance: Good hypertension, (-) angina+ DVT (in the past)  (-) CAD, (-) Past MI, (-) Cardiac Stents and (-) CABG (-) dysrhythmias + Valvular Problems/Murmurs MVP      Neuro/Psych negative neurological ROS  negative psych ROS   GI/Hepatic Neg liver ROS, GERD  ,  Endo/Other  diabetes (borderline)Morbid obesity  Renal/GU negative Renal ROS  negative genitourinary   Musculoskeletal   Abdominal   Peds  Hematology negative hematology ROS (+)   Anesthesia Other Findings Past Medical History: No date: Arthritis No date: Asthma     Comment:  wheezing with URI- no inhalers day to day No date: Cervical disc herniation No date: Colon adenomas No date: Deep vein thrombosis (DVT) during current hospitalization  ([place]) No date: Easy bruising No date: GERD (gastroesophageal reflux disease) No date: Hypertension No date: Mitral valve prolapse     Comment:  eccho 7/13 chart 7/12: Peripheral vascular disease ([place])     Comment:  DVT  left leg  post op- coumadin x 6 months No date: Sleep apnea   Reproductive/Obstetrics negative OB ROS                             Anesthesia Physical Anesthesia Plan  ASA: III  Anesthesia Plan: General   Post-op Pain Management:    Induction: Intravenous  PONV Risk Score and Plan: 3 and Propofol infusion  Airway Management Planned:  Nasal Cannula  Additional Equipment:   Intra-op Plan:   Post-operative Plan:   Informed Consent: I have reviewed the patients History and Physical, chart, labs and discussed the procedure including the risks, benefits and alternatives for the proposed anesthesia with the patient or authorized representative who has indicated his/her understanding and acceptance.   Dental Advisory Given  Plan Discussed with: Anesthesiologist, CRNA and Surgeon  Anesthesia Plan Comments:         Anesthesia Quick Evaluation

## 2018-02-13 NOTE — Anesthesia Procedure Notes (Signed)
Performed by: Cook-Martin, Juliona Vales °Pre-anesthesia Checklist: Patient identified, Emergency Drugs available, Suction available, Patient being monitored and Timeout performed °Patient Re-evaluated:Patient Re-evaluated prior to induction °Oxygen Delivery Method: Simple face mask °Preoxygenation: Pre-oxygenation with 100% oxygen °Induction Type: IV induction °Placement Confirmation: positive ETCO2 and CO2 detector ° ° ° ° ° ° °

## 2018-02-14 ENCOUNTER — Encounter: Payer: Self-pay | Admitting: Gastroenterology

## 2018-02-15 LAB — SURGICAL PATHOLOGY

## 2018-06-05 ENCOUNTER — Ambulatory Visit: Payer: 59 | Admitting: Anesthesiology

## 2018-06-05 ENCOUNTER — Encounter
Admission: RE | Disposition: A | Discharge status: Home / Self Care | Payer: Self-pay | Source: Ambulatory Visit | Attending: Gastroenterology

## 2018-06-05 ENCOUNTER — Ambulatory Visit
Admission: RE | Admit: 2018-06-05 | Discharge: 2018-06-05 | Disposition: A | Discharge status: Home / Self Care | Payer: 59 | Source: Ambulatory Visit | Attending: Gastroenterology | Admitting: Gastroenterology

## 2018-06-05 ENCOUNTER — Encounter: Payer: Self-pay | Admitting: *Deleted

## 2018-06-05 DIAGNOSIS — Z8719 Personal history of other diseases of the digestive system: Secondary | ICD-10-CM | POA: Insufficient documentation

## 2018-06-05 DIAGNOSIS — Z881 Allergy status to other antibiotic agents status: Secondary | ICD-10-CM | POA: Diagnosis not present

## 2018-06-05 DIAGNOSIS — Z885 Allergy status to narcotic agent status: Secondary | ICD-10-CM | POA: Insufficient documentation

## 2018-06-05 DIAGNOSIS — Z6841 Body Mass Index (BMI) 40.0 and over, adult: Secondary | ICD-10-CM | POA: Insufficient documentation

## 2018-06-05 DIAGNOSIS — Z882 Allergy status to sulfonamides status: Secondary | ICD-10-CM | POA: Insufficient documentation

## 2018-06-05 DIAGNOSIS — K219 Gastro-esophageal reflux disease without esophagitis: Secondary | ICD-10-CM | POA: Insufficient documentation

## 2018-06-05 DIAGNOSIS — Z880 Allergy status to penicillin: Secondary | ICD-10-CM | POA: Diagnosis not present

## 2018-06-05 DIAGNOSIS — I10 Essential (primary) hypertension: Secondary | ICD-10-CM | POA: Diagnosis not present

## 2018-06-05 DIAGNOSIS — J45909 Unspecified asthma, uncomplicated: Secondary | ICD-10-CM | POA: Insufficient documentation

## 2018-06-05 DIAGNOSIS — I739 Peripheral vascular disease, unspecified: Secondary | ICD-10-CM | POA: Diagnosis not present

## 2018-06-05 DIAGNOSIS — Z09 Encounter for follow-up examination after completed treatment for conditions other than malignant neoplasm: Secondary | ICD-10-CM | POA: Insufficient documentation

## 2018-06-05 DIAGNOSIS — D129 Benign neoplasm of anus and anal canal: Secondary | ICD-10-CM | POA: Diagnosis not present

## 2018-06-05 DIAGNOSIS — Z87891 Personal history of nicotine dependence: Secondary | ICD-10-CM | POA: Insufficient documentation

## 2018-06-05 DIAGNOSIS — Z79899 Other long term (current) drug therapy: Secondary | ICD-10-CM | POA: Diagnosis not present

## 2018-06-05 HISTORY — PX: FLEXIBLE SIGMOIDOSCOPY: SHX5431

## 2018-06-05 SURGERY — SIGMOIDOSCOPY, FLEXIBLE
Anesthesia: General

## 2018-06-05 MED ORDER — PROPOFOL 10 MG/ML IV BOLUS
INTRAVENOUS | Status: AC
  Filled 2018-06-05: qty 20

## 2018-06-05 MED ORDER — PROPOFOL 500 MG/50ML IV EMUL
INTRAVENOUS | Status: DC | PRN
  Administered 2018-06-05: 180 ug/kg/min via INTRAVENOUS

## 2018-06-05 MED ORDER — FENTANYL CITRATE (PF) 100 MCG/2ML IJ SOLN
INTRAMUSCULAR | Status: AC
  Filled 2018-06-05: qty 2

## 2018-06-05 MED ORDER — PHENYLEPHRINE HCL 10 MG/ML IJ SOLN
INTRAMUSCULAR | Status: DC | PRN
  Administered 2018-06-05: 100 ug via INTRAVENOUS
  Administered 2018-06-05: 200 ug via INTRAVENOUS
  Administered 2018-06-05 (×2): 100 ug via INTRAVENOUS
  Administered 2018-06-05: 150 ug via INTRAVENOUS

## 2018-06-05 MED ORDER — SODIUM CHLORIDE 0.9 % IV SOLN
INTRAVENOUS | Status: DC
  Administered 2018-06-05: 13:00:00 via INTRAVENOUS

## 2018-06-05 MED ORDER — PROPOFOL 10 MG/ML IV BOLUS: INTRAVENOUS | Status: DC | PRN

## 2018-06-05 MED ORDER — MIDAZOLAM HCL 2 MG/2ML IJ SOLN
INTRAMUSCULAR | Status: DC | PRN
  Administered 2018-06-05: 2 mg via INTRAVENOUS

## 2018-06-05 MED ORDER — MIDAZOLAM HCL 2 MG/2ML IJ SOLN
INTRAMUSCULAR | Status: AC
  Filled 2018-06-05: qty 2

## 2018-06-05 MED ORDER — FENTANYL CITRATE (PF) 100 MCG/2ML IJ SOLN
INTRAMUSCULAR | Status: DC | PRN
  Administered 2018-06-05 (×2): 25 ug via INTRAVENOUS

## 2018-06-05 MED ORDER — SPOT INK MARKER SYRINGE KIT
PACK | SUBMUCOSAL | Status: DC | PRN
  Administered 2018-06-05: 2 mL via SUBMUCOSAL

## 2018-06-05 NOTE — Anesthesia Preprocedure Evaluation (Addendum)
Anesthesia Evaluation  Patient identified by MRN, date of birth, ID band Patient awake    Reviewed: Allergy & Precautions, H&P , NPO status , Patient's Chart, lab work & pertinent test results, reviewed documented beta blocker date and time   Airway Mallampati: III  TM Distance: >3 FB Neck ROM: full    Dental  (+) Dental Advidsory Given, Teeth Intact   Pulmonary neg shortness of breath, asthma , sleep apnea and Continuous Positive Airway Pressure Ventilation , neg COPD, neg recent URI, former smoker,           Cardiovascular Exercise Tolerance: Good hypertension, (-) angina+ Peripheral Vascular Disease and + DVT (in the past)  (-) CAD, (-) Past MI, (-) Cardiac Stents and (-) CABG (-) dysrhythmias + Valvular Problems/Murmurs MVP      Neuro/Psych negative neurological ROS  negative psych ROS   GI/Hepatic Neg liver ROS, GERD  ,  Endo/Other  diabetes (borderline)Morbid obesity  Renal/GU negative Renal ROS  negative genitourinary   Musculoskeletal   Abdominal   Peds  Hematology negative hematology ROS (+)   Anesthesia Other Findings Past Medical History: No date: Arthritis No date: Asthma     Comment:  wheezing with URI- no inhalers day to day No date: Cervical disc herniation No date: Colon adenomas No date: Deep vein thrombosis (DVT) during current hospitalization  ([place]) No date: Easy bruising No date: GERD (gastroesophageal reflux disease) No date: Hypertension No date: Mitral valve prolapse     Comment:  eccho 7/13 chart 7/12: Peripheral vascular disease ([place])     Comment:  DVT  left leg  post op- coumadin x 6 months No date: Sleep apnea   Reproductive/Obstetrics negative OB ROS                             Anesthesia Physical  Anesthesia Plan  ASA: III  Anesthesia Plan: General   Post-op Pain Management:    Induction: Intravenous  PONV Risk Score and Plan: 3 and Propofol  infusion  Airway Management Planned: Nasal Cannula  Additional Equipment:   Intra-op Plan:   Post-operative Plan:   Informed Consent: I have reviewed the patients History and Physical, chart, labs and discussed the procedure including the risks, benefits and alternatives for the proposed anesthesia with the patient or authorized representative who has indicated his/her understanding and acceptance.   Dental Advisory Given  Plan Discussed with: Anesthesiologist, CRNA and Surgeon  Anesthesia Plan Comments:         Anesthesia Quick Evaluation

## 2018-06-05 NOTE — H&P (Signed)
Outpatient short stay form Pre-procedure 06/05/2018 2:23 PM Lollie Sails MD  Primary Physician: Emily Filbert, MD  Reason for visit: Sedated flexible sigmoidoscopy  History of present illness: Patient is a 67 year old female presenting today as above.  She has a history of a colonoscopy being done on 02/13/2018 finding of a serrated adenoma just at or above the anal verge.  Biopsy was taken of this, negative for high-grade dysplasia or malignancy however the lesion appeared to be much larger atypical in appearance, at that time.  She is returning for repeat check on this possible removal, discussed with the patient that should I decide this is not amenable to endoscopic resection that I will likely tattoo the region for future transanal surgical resection.  Patient takes no aspirin products or blood thinners.  She is also held her tumor work for at least a week.    Current Facility-Administered Medications:  .  0.9 %  sodium chloride infusion, , Intravenous, Continuous, Lollie Sails, MD, Last Rate: 20 mL/hr at 06/05/18 1325  Medications Prior to Admission  Medication Sig Dispense Refill Last Dose  . bisoprolol-hydrochlorothiazide (ZIAC) 2.5-6.25 MG per tablet Take 1 tablet by mouth every morning.    06/05/2018 at Unknown time  . CALCIUM CARBONATE-VIT D-MIN PO Take by mouth daily.   Past Week at Unknown time  . Cholecalciferol (VITAMIN D3) 2000 UNITS TABS Take 1 tablet by mouth daily.    Past Week at Unknown time  . DULoxetine (CYMBALTA) 60 MG capsule Take 60 mg by mouth every evening.    06/04/2018 at Unknown time  . Flavocoxid (LIMBREL) 500 MG CAPS Take 500 mg by mouth 2 (two) times daily.   06/04/2018 at Unknown time  . furosemide (LASIX) 20 MG tablet Take 20 mg by mouth 2 (two) times daily.    06/04/2018 at Unknown time  . metaxalone (SKELAXIN) 800 MG tablet Take 800 mg by mouth 2 (two) times daily.    06/04/2018 at Unknown time  . NEXIUM 40 MG capsule Take 40 mg by mouth daily before  breakfast.    06/04/2018 at Unknown time  . potassium chloride (K-DUR) 10 MEQ tablet Take 10 mEq by mouth 2 (two) times daily.    Past Week at Unknown time  . sucralfate (CARAFATE) 1 G tablet Take 1 tablet (1 g total) by mouth 4 (four) times daily -  with meals and at bedtime. 40 tablet 1 Past Month at Unknown time  . telmisartan (MICARDIS) 40 MG tablet Take 40 mg by mouth daily.   06/05/2018 at Unknown time  . losartan (COZAAR) 25 MG tablet Take 25 mg by mouth 2 (two) times daily.    Not Taking at Unknown time  . OMEGA-3 FATTY ACIDS PO Take by mouth daily.   Not Taking at Unknown time  . OVER THE COUNTER MEDICATION Apply 1 application topically every 6 (six) hours as needed. toprisin topical cream for pain   Taking     Allergies  Allergen Reactions  . Morphine And Related Other (See Comments)    Elephant on chest  . Novocain [Procaine] Other (See Comments)    blackout  . Aspirin Other (See Comments)    Ringing in the ears  . Doxycycline Hyclate Diarrhea  . Levaquin [Levofloxacin In D5w] Diarrhea  . Penicillins Rash  . Sulfur Other (See Comments)    sweating  . Tape Other (See Comments)    USE PAPER TAPE     Past Medical History:  Diagnosis Date  .  Arthritis   . Asthma    wheezing with URI- no inhalers day to day  . Cervical disc herniation   . Colon adenomas   . Deep vein thrombosis (DVT) during current hospitalization ([place])   . Easy bruising   . GERD (gastroesophageal reflux disease)   . Hypertension   . Mitral valve prolapse    eccho 7/13 chart  . Peripheral vascular disease ([place]) 7/12   DVT  left leg  post op- coumadin x 6 months  . Sleep apnea     Review of systems:      Physical Exam    Heart and lungs: Regular rate and rhythm without rub or gallop, lungs are bilaterally clear.    HEENT: Normocephalic atraumatic eyes are anicteric    Other:    Pertinant exam for procedure: Soft nontender nondistended bowel sounds positive normoactive    Planned  proceedures: Sedated flexible sigmoidoscopy and indicated procedures. I have discussed the risks benefits and complications of procedures to include not limited to bleeding, infection, perforation and the risk of sedation and the patient wishes to proceed.    Lollie Sails, MD Gastroenterology 06/05/2018  2:23 PM

## 2018-06-05 NOTE — Anesthesia Procedure Notes (Signed)
Date/Time: 06/05/2018 2:30 PM Performed by: Allean Found, CRNA Pre-anesthesia Checklist: Patient identified, Emergency Drugs available, Suction available, Patient being monitored and Timeout performed Patient Re-evaluated:Patient Re-evaluated prior to induction Oxygen Delivery Method: Nasal cannula Placement Confirmation: positive ETCO2

## 2018-06-05 NOTE — Transfer of Care (Signed)
Immediate Anesthesia Transfer of Care Note  Patient: Onia A Basic  Procedure(s) Performed: FLEXIBLE SIGMOIDOSCOPY (N/A )  Patient Location: PACU  Anesthesia Type:General  Level of Consciousness: awake, alert  and oriented  Airway & Oxygen Therapy: Patient Spontanous Breathing and Patient connected to nasal cannula oxygen  Post-op Assessment: Report given to RN and Post -op Vital signs reviewed and stable  Post vital signs: Reviewed and stable  Last Vitals:  Vitals Value Taken Time  BP 102/68 06/05/2018  3:11 PM  Temp 36.1 C 06/05/2018  3:11 PM  Pulse 79 06/05/2018  3:12 PM  Resp 8 06/05/2018  3:12 PM  SpO2 98 % 06/05/2018  3:12 PM  Vitals shown include unvalidated device data.  Last Pain:  Vitals:   06/05/18 1251  TempSrc: Tympanic         Complications: No apparent anesthesia complications

## 2018-06-05 NOTE — Op Note (Addendum)
[facility] Gastroenterology Patient Name: Kelly Perry Procedure Date: 06/05/2018 2:14 PM MRN: [id number] Account #: [id number] Date of Birth: Dec 03, 1950 Admit Type: Outpatient Age: 67 Room: [facility] ENDO ROOM 3 Gender: Female Note Status: Finalized Procedure:            Flexible Sigmoidoscopy Providers:            Lollie Sails, MD Referring MD:         Rusty Aus, MD (Referring MD) Complications:        No immediate complications. Procedure:            Pre-Anesthesia Assessment:                       - ASA Grade Assessment: III - A patient with severe                        systemic disease.                       After obtaining informed consent, the scope was passed                        under direct vision. The Colonoscope was introduced                        through the anus and advanced to the the rectum. The                        flexible sigmoidoscopy was accomplished without                        difficulty. The patient tolerated the procedure well. Findings:      - the scope was advanced into the rectum. The previously noted lesion       was easily found, evaluated in both forward view and retroflex view,       located at about 4:00 to 6:00 with the anterior midline being 12:00. One       section was removed with a cold snare, recovered for histology. With       this removed the size of the lesion was better appreciated, with       possible small overlap at the anal verge noted. Hemostasis was good.       Tattoo was placed at both lateral edges of the lesion, with the lesion       between, for locating purposes.      atypical distal rectal lesion. Impression:           -                       atypical distal rectal lesion, known adenoma. Recommendation:       - Clear liquid diet today, full liquid diet for 2 days,                        then advance to soft.                       - Refer to a surgeon at appointment to be scheduled.                   - Referral for transanal excision.                       -  Discharge patient to home. Procedure Code(s):    --- Professional ---                       [phone], 52, Sigmoidoscopy, flexible; diagnostic,                        including collection of specimen(s) by brushing or                        washing, when performed (separate procedure) CPT copyright 2017 American Medical Association. All rights reserved. The codes documented in this report are preliminary and upon coder review may  be revised to meet current compliance requirements. Lollie Sails, MD 06/05/2018 3:26:01 PM This report has been signed electronically. Number of Addenda: 0 Note Initiated On: 06/05/2018 2:14 PM Total Procedure Duration: 0 hours 21 minutes 14 seconds       [facility]

## 2018-06-05 NOTE — Anesthesia Post-op Follow-up Note (Signed)
Anesthesia QCDR form completed.        

## 2018-06-06 NOTE — Anesthesia Postprocedure Evaluation (Signed)
Anesthesia Post Note  Patient: Kelly Perry  Procedure(s) Performed: FLEXIBLE SIGMOIDOSCOPY (N/A )  Patient location during evaluation: PACU Anesthesia Type: General Level of consciousness: awake and alert and oriented Pain management: pain level controlled Vital Signs Assessment: post-procedure vital signs reviewed and stable Respiratory status: spontaneous breathing Cardiovascular status: blood pressure returned to baseline Anesthetic complications: no     Last Vitals:  Vitals:   06/05/18 1521 06/05/18 1531  BP: 108/68 112/62  Pulse: 73 63  Resp: 17 13  Temp:    SpO2: 100% 100%    Last Pain:  Vitals:   06/05/18 1531  TempSrc:   PainSc: 0-No pain                 Aly Seidenberg

## 2018-06-07 LAB — SURGICAL PATHOLOGY

## 2018-06-08 ENCOUNTER — Ambulatory Visit: Payer: 59 | Admitting: General Surgery

## 2018-06-08 ENCOUNTER — Encounter: Payer: Self-pay | Admitting: General Surgery

## 2018-06-08 VITALS — BP 116/62 | HR 83 | Resp 11 | Ht 64.0 in | Wt 239.0 lb

## 2018-06-08 DIAGNOSIS — K621 Rectal polyp: Secondary | ICD-10-CM | POA: Diagnosis not present

## 2018-06-08 NOTE — Patient Instructions (Signed)
The patient is aware to call back for any questions or concerns.  

## 2018-06-08 NOTE — Progress Notes (Signed)
Patient ID: Kelly Perry, female   DOB: 1951/06/19, 67 y.o.   MRN: [id number]  Chief Complaint  Patient presents with  . Other    rectal polyp    HPI Kelly Perry is a 67 y.o. female referred here by Dr Donnella Sham for evaluation of a rectal polyp found on her last flexible sigmoidoscopy done on 06/05/18.  Since her previous surgery with Dr Pat Patrick she was a little hesitant to have her follow up colonoscopy. She states she has a lot of gas and she is eating full liquid diet, tomorrow she can advance to soft. Bowels move daily, no bleeding.  HPI  Past Medical History:  Diagnosis Date  . Arthritis   . Asthma    wheezing with URI- no inhalers day to day  . Cervical disc herniation   . Colon adenomas   . Deep vein thrombosis (DVT) during current hospitalization ([place])   . Easy bruising   . GERD (gastroesophageal reflux disease)   . Hypertension   . Mitral valve prolapse    eccho 7/13 chart  . Peripheral vascular disease ([place]) 7/12   DVT  left leg  post op- coumadin x 6 months  . Sleep apnea     Past Surgical History:  Procedure Laterality Date  . ABDOMINAL HYSTERECTOMY  2007   total  . CERVICAL SPINE SURGERY    . CHOLECYSTECTOMY    . COLON SURGERY  06/30/10   colectomy, Dr Pat Patrick  . COLONOSCOPY WITH PROPOFOL N/A 02/13/2018   Procedure: COLONOSCOPY WITH PROPOFOL;  Surgeon: Lollie Sails, MD;  Location: [facility] ENDOSCOPY;  Service: Endoscopy;  Laterality: N/A;  . COLOSTOMY  2011   Dr Pat Patrick  . COLOSTOMY TAKEDOWN  02/2011   Dr Pat Patrick  . FLEXIBLE SIGMOIDOSCOPY N/A 06/05/2018   Procedure: FLEXIBLE SIGMOIDOSCOPY;  Surgeon: Lollie Sails, MD;  Location[facility] ENDOSCOPY;  Service: Endoscopy;  Laterality: N/A;  . INSERTION OF MESH N/A 02/26/2013   Procedure: INSERTION OF MESH;  Surgeon: Imogene Burn. Georgette Dover, MD;  Location: [facility] ORS;  Service: General;  Laterality: N/A;  . LYSIS OF ADHESION N/A 02/26/2013   Procedure: LYSIS OF ADHESION;  Surgeon: Imogene Burn. Georgette Dover, MD;  Location: [facility] ORS;   Service: General;  Laterality: N/A;  . SPINE SURGERY  2010   cervical neck fusion/  LIMITED MOBILITY  . TONSILLECTOMY    . VENTRAL HERNIA REPAIR N/A 02/26/2013   Procedure: HERNIA REPAIR VENTRAL ADULT;  Surgeon: Imogene Burn. Georgette Dover, MD;  Location: [facility] ORS;  Service: General;  Laterality: N/A;    Family History  Problem Relation Age of Onset  . Arthritis Mother   . Hypertension Mother   . Breast cancer Paternal Aunt 5  . Colon cancer Maternal Grandfather 33  . Colon cancer Cousin        maternal    Social History Social History   Tobacco Use  . Smoking status: Former Smoker    Packs/day: 1.00    Years: 10.00    Pack years: 10.00    Types: Cigarettes    Last attempt to quit: 01/16/1985    Years since quitting: 33.4  . Smokeless tobacco: Never Used  Substance Use Topics  . Alcohol use: Yes    Comment: occ  . Drug use: No    Allergies  Allergen Reactions  . Morphine And Related Other (See Comments)    Elephant on chest  . Novocain [Procaine] Other (See Comments)    blackout  . Aspirin Other (See Comments)  Ringing in the ears  . Doxycycline Hyclate Diarrhea  . Levaquin [Levofloxacin In D5w] Diarrhea  . Penicillins Rash  . Sulfur Other (See Comments)    sweating  . Tape Other (See Comments)    USE PAPER TAPE    Current Outpatient Medications  Medication Sig Dispense Refill  . bisoprolol-hydrochlorothiazide (ZIAC) 2.5-6.25 MG per tablet Take 1 tablet by mouth every morning.     Marland Kitchen CALCIUM CARBONATE-VIT D-MIN PO Take by mouth daily.    . DULoxetine (CYMBALTA) 60 MG capsule Take 60 mg by mouth every evening.     . furosemide (LASIX) 20 MG tablet Take 20 mg by mouth 2 (two) times daily.     . metaxalone (SKELAXIN) 800 MG tablet Take 800 mg by mouth 2 (two) times daily.     Marland Kitchen NEXIUM 40 MG capsule Take 40 mg by mouth daily before breakfast.     . OVER THE COUNTER MEDICATION Apply 1 application topically every 6 (six) hours as needed. toprisin topical cream for pain    .  potassium chloride (K-DUR) 10 MEQ tablet Take 10 mEq by mouth 2 (two) times daily.     Marland Kitchen telmisartan (MICARDIS) 40 MG tablet Take 40 mg by mouth daily.     No current facility-administered medications for this visit.     Review of Systems Review of Systems  Constitutional: Negative.   Respiratory: Negative.   Cardiovascular: Negative.   Gastrointestinal: Negative for constipation and vomiting.    Blood pressure 116/62, pulse 83, resp. rate 11, height 5\' 4"  (1.626 m), weight 239 lb (108.4 kg), SpO2 96 %.  Physical Exam Physical Exam  Constitutional: She is oriented to person, place, and time. She appears well-developed and well-nourished.  HENT:  Mouth/Throat: Oropharynx is clear and moist.  Eyes: Conjunctivae are normal.  Neck: Neck supple.  Cardiovascular: Normal rate, regular rhythm and normal heart sounds.  Pulmonary/Chest: Effort normal and breath sounds normal.  Abdominal: Soft. Bowel sounds are normal. No hernia.    Diastasis recti present.  Genitourinary:  Genitourinary Comments:    Neurological: She is alert and oriented to person, place, and time.  Skin: Skin is warm and dry.  Psychiatric: Her behavior is normal.    Data Reviewed February 13, 2018 colonoscopy reviewed. A. COLON POLYP X2, TRANSVERSE; COLD SNARE:  - TUBULAR ADENOMAS (2).  - NEGATIVE FOR HIGH-GRADE DYSPLASIA AND MALIGNANCY.   B. ILEOCECAL VALVE; COLD BIOPSY:  - FOCAL MILD ACTIVE ENTERITIS.  - NEGATIVE FOR DYSPLASIA AND MALIGNANCY.   C. ATYPICAL LESION ABOVE ANAL VERGE; COLD SNARE:  - TRADITIONAL SERRATED ADENOMA, FRAGMENTS.  - NEGATIVE FOR HIGH-GRADE DYSPLASIA AND MALIGNANCY.   June 05, 2018 flexible sigmoidoscopy follow-up of the rectal lesion reviewed. A. ANAL VERGE, ATYPICAL LESION; COLD SNARE:  - TUBULAR ADENOMA.  - NEGATIVE FOR HIGH-GRADE DYSPLASIA AND MALIGNANCY.   Case discussed in person with Dr. Gustavo Lah.  Digital rectal exam showed a soft ruffle fullness posteriorly.   Initial anoscopy failed to adequately visualize the area without air insufflation and a rigid sigmoidoscope was utilized to visualize the area.  This appears to be posteriorly based as described in Dr. Marton Redwood colonoscopy report.  Assessment    Low rectal polyp, not readily amenable to endoscopic resection.    Plan    Indications for transanal excision reviewed.  The patient would like to postpone this until after the Christmas holidays which is reasonable and that both biopsies did not show any atypia.  We will plan for reassessment after the  first of the year.  Follow up in 2 months.     HPI, Physical Exam, Assessment and Plan have been scribed under the direction and in the presence of Robert Bellow, MD. Karie Fetch, RN  I have completed the exam and reviewed the above documentation for accuracy and completeness.  I agree with the above.  Haematologist has been used and any errors in dictation or transcription are unintentional.  Hervey Ard, M.D., F.A.C.S.  Forest Gleason Kristine Tiley 06/09/2018, 3:30 PM

## 2018-06-09 DIAGNOSIS — K621 Rectal polyp: Secondary | ICD-10-CM | POA: Insufficient documentation

## 2018-08-14 ENCOUNTER — Telehealth: Payer: Self-pay

## 2018-08-14 NOTE — Telephone Encounter (Signed)
Patient in recalls to see about scheduling surgery for mid January. Call to patient to see if she is ready to schedule surgery for January. Will need a pre op with Dr Bary Castilla prior. Scheduling sheet on his desk.

## 2018-08-24 ENCOUNTER — Telehealth: Payer: Self-pay

## 2018-08-24 NOTE — Telephone Encounter (Signed)
Patient called back to schedule her surgery. She is scheduled for surgery at [facility] with Dr Bary Castilla on 10/13/18. She will see him in office for a short pre op on 09/26/18 at 8:45 am. She will pre admit at the hospital on 09/29/18 at 9:00 am. Surgery information has been mailed to the patient. The patient is aware of dates, times, and instructions.  Arvilla Meres, RN will be assisting with this case.

## 2018-09-20 ENCOUNTER — Telehealth: Payer: Self-pay | Admitting: *Deleted

## 2018-09-20 NOTE — Telephone Encounter (Signed)
Message left for patient to call the office.   We need to inform patient that Dr. Bary Castilla is on leave and need to see if she would like to see another surgeon.   It may be that patient can see Dr. Rosana Hoes and keep pre-op as scheduled for 09-26-18 and the same surgery date of 10-13-18.   Will await patient's phone call.

## 2018-09-21 NOTE — Telephone Encounter (Signed)
Patient called the office back today and was notified per previous message.   She is not sure if she wants to proceed with Rosana Hoes for surgery. This will be determined by the patient at her office visit on 09-26-18 with Dr. Rosana Hoes.   Patient is scheduled to pre-admit on 09-29-18.  If she does not want surgery with Rosana Hoes this will need to be cancelled. If she does, she need to get back on the O.R. schedule[place].  *Patient originally scheduled for surgery with Dr. Bary Castilla on 10-13-18 for a transanal excision rectal polyp.

## 2018-09-26 ENCOUNTER — Ambulatory Visit: Payer: 59 | Admitting: General Surgery

## 2018-09-26 ENCOUNTER — Ambulatory Visit: Payer: 59 | Admitting: Surgery

## 2018-09-28 ENCOUNTER — Telehealth: Payer: Self-pay | Admitting: *Deleted

## 2018-09-28 ENCOUNTER — Telehealth: Payer: Self-pay

## 2018-09-28 NOTE — Telephone Encounter (Signed)
-----   Message from Larna Daughters sent at 09/25/2018  2:21 PM EST ----- Patient has called and cancelled her appointment with Dr. Rosana Hoes, patient is schedule for pre-op this Friday and would like to cancel. Patient would like to speak to her pcp about how long she can wait to do surgery and will call us back after speaking with them.

## 2018-09-28 NOTE — Telephone Encounter (Signed)
Kelly Perry contacted patient today and notified that Dr. Bary Castilla will return next week.   Patient wishes to get surgery rescheduled with him.   She is requesting that this be rescheduled for 10-27-18.   Patient will be contacted once scheduled with [facility]. She will also be notified of Pre-admit appointment date and time once arranged.   The patient is scheduled for a pre-op visit on 10-03-18 with Dr. Bary Castilla.

## 2018-09-29 ENCOUNTER — Inpatient Hospital Stay: Admission: RE | Admit: 2018-09-29 | Payer: 59 | Source: Ambulatory Visit

## 2018-10-02 ENCOUNTER — Telehealth: Payer: Self-pay | Admitting: *Deleted

## 2018-10-02 NOTE — Telephone Encounter (Signed)
Patient contacted and notified that we can accommodate surgery on 10-27-18 due to a schedule change. The patient is appreciative.   New surgery date for 10-27-18. She is aware to Pre-admit on 10-16-18 at 7:30 am.   Further instructions will be reviewed tomorrow at pre-op visit. Patient agreeable.

## 2018-10-03 ENCOUNTER — Encounter: Payer: Self-pay | Admitting: General Surgery

## 2018-10-03 ENCOUNTER — Ambulatory Visit (INDEPENDENT_AMBULATORY_CARE_PROVIDER_SITE_OTHER): Payer: 59 | Admitting: General Surgery

## 2018-10-03 ENCOUNTER — Other Ambulatory Visit: Payer: Self-pay

## 2018-10-03 VITALS — BP 155/84 | HR 82 | Temp 97.5°F | Resp 16 | Ht 64.0 in | Wt 251.2 lb

## 2018-10-03 DIAGNOSIS — K621 Rectal polyp: Secondary | ICD-10-CM | POA: Diagnosis not present

## 2018-10-03 MED ORDER — POLYETHYLENE GLYCOL 3350 17 GM/SCOOP PO POWD: ORAL | 0 refills | Status: DC

## 2018-10-03 NOTE — Progress Notes (Signed)
Patient ID: Kelly Perry, female   DOB: [birth year], 68 y.o.   MRN: [id number]  Chief Complaint  Patient presents with  . Pre-op Exam    Pre-op Transanal excision rectal polyp    HPI Kelly Perry is a 68 y.o. female.  Here for pre op exam, transanal excision rectal polyp on 10-27-18.   HPI  Past Medical History:  Diagnosis Date  . Arthritis   . Asthma    wheezing with URI- no inhalers day to day  . Cervical disc herniation   . Colon adenomas   . Deep vein thrombosis (DVT) during current hospitalization ([place])   . Easy bruising   . GERD (gastroesophageal reflux disease)   . Hypertension   . Mitral valve prolapse    eccho 7/13 chart  . Peripheral vascular disease ([place]) 7/12   DVT  left leg  post op- coumadin x 6 months  . Sleep apnea     Past Surgical History:  Procedure Laterality Date  . ABDOMINAL HYSTERECTOMY  2007   total  . CERVICAL SPINE SURGERY    . CHOLECYSTECTOMY    . COLON SURGERY  06/30/10   colectomy, Dr Pat Patrick  . COLONOSCOPY WITH PROPOFOL N/A 02/13/2018   Procedure: COLONOSCOPY WITH PROPOFOL;  Surgeon: Lollie Sails, MD;  Location: [facility] ENDOSCOPY;  Service: Endoscopy;  Laterality: N/A;  . COLOSTOMY  2011   Dr Pat Patrick  . COLOSTOMY TAKEDOWN  02/2011   Dr Pat Patrick  . FLEXIBLE SIGMOIDOSCOPY N/A 06/05/2018   Procedure: FLEXIBLE SIGMOIDOSCOPY;  Surgeon: Lollie Sails, MD;  Location: [facility] ENDOSCOPY;  Service: Endoscopy;  Laterality: N/A;  . INSERTION OF MESH N/A 02/26/2013   Procedure: INSERTION OF MESH;  Surgeon: Imogene Burn. Georgette Dover, MD;  Location: [facility] ORS;  Service: General;  Laterality: N/A;  . LYSIS OF ADHESION N/A 02/26/2013   Procedure: LYSIS OF ADHESION;  Surgeon: Imogene Burn. Georgette Dover, MD;  Location: [facility] ORS;  Service: General;  Laterality: N/A;  . SPINE SURGERY  2010   cervical neck fusion/  LIMITED MOBILITY  . TONSILLECTOMY    . VENTRAL HERNIA REPAIR N/A 02/26/2013   Procedure: HERNIA REPAIR VENTRAL ADULT;  Surgeon: Imogene Burn. Georgette Dover, MD;  Location: [facility]  ORS;  Service: General;  Laterality: N/A;    Family History  Problem Relation Age of Onset  . Arthritis Mother   . Hypertension Mother   . Breast cancer Paternal Aunt 58  . Colon cancer Maternal Grandfather 19  . Colon cancer Cousin        maternal    Social History Social History   Tobacco Use  . Smoking status: Former Smoker    Packs/day: 1.00    Years: 10.00    Pack years: 10.00    Types: Cigarettes    Last attempt to quit: 01/16/1985    Years since quitting: 33.7  . Smokeless tobacco: Never Used  Substance Use Topics  . Alcohol use: Yes    Comment: occ  . Drug use: No    Allergies  Allergen Reactions  . Morphine And Related Other (See Comments)    Elephant on chest  . Novocain [Procaine] Other (See Comments)    blackout  . Aspirin Other (See Comments)    Ringing in the ears  . Doxycycline Hyclate Diarrhea  . Levaquin [Levofloxacin In D5w] Diarrhea  . Penicillins Rash  . Sulfur Other (See Comments)    sweating  . Tape Other (See Comments)    USE PAPER TAPE    Current Outpatient  Medications  Medication Sig Dispense Refill  . acetaminophen (TYLENOL) 650 MG CR tablet Take 1,300 mg by mouth every 8 (eight) hours as needed for pain.    . bisoprolol-hydrochlorothiazide (ZIAC) 2.5-6.25 MG per tablet Take 1 tablet by mouth every morning.     Renard Hamper Pepper-Turmeric (TURMERIC CURCUMIN) 01-999 MG CAPS Take by mouth.    Azucena Freed SERRATA PO Take by mouth.    . Calcium Carb-Cholecalciferol (CALCIUM 600/VITAMIN D3 PO) Take 1 tablet by mouth daily.    . DULoxetine (CYMBALTA) 60 MG capsule Take 60 mg by mouth every evening.     Marland Kitchen esomeprazole (NEXIUM) 40 MG capsule Take 40 mg by mouth daily.    . furosemide (LASIX) 20 MG tablet Take 40 mg by mouth daily.     . metaxalone (SKELAXIN) 800 MG tablet Take 800 mg by mouth 2 (two) times daily.     Marland Kitchen OVER THE COUNTER MEDICATION Apply 1 application topically every 6 (six) hours as needed (for pain). Toprisin Topical Cream    .  OVER THE COUNTER MEDICATION Take 2 capsules by mouth 3 (three) times daily. Turmeric Rose Mary Leaf Dogwood Bark Supplement    . potassium chloride (K-DUR) 10 MEQ tablet Take 10 mEq by mouth daily.     Marland Kitchen telmisartan (MICARDIS) 40 MG tablet Take 40 mg by mouth daily.    . polyethylene glycol powder (GLYCOLAX/MIRALAX) powder Mix whole container with 64 ounces of clear liquids, No Red liquids. 255 g 0   No current facility-administered medications for this visit.     Review of Systems Review of Systems  Constitutional: Negative.   Respiratory: Negative.   Cardiovascular: Negative.     Blood pressure (!) 155/84, pulse 82, temperature (!) 97.5 F (36.4 C), temperature source Temporal, resp. rate 16, height 5\' 4"  (1.626 m), weight 251 lb 3.2 oz (113.9 kg), SpO2 97 %.  Physical Exam Physical Exam Constitutional:      Appearance: Normal appearance.  Neck:     Musculoskeletal: Normal range of motion and neck supple.  Cardiovascular:     Rate and Rhythm: Normal rate and regular rhythm.  Pulmonary:     Effort: Pulmonary effort is normal.     Breath sounds: Normal breath sounds.  Neurological:     Mental Status: She is alert.     Data Reviewed 2019 colonoscopy and flexible sigmoidoscopy.  Pathology report.  Posterior based lesion. June 05, 2018 pathology: A. ANAL VERGE, ATYPICAL LESION; COLD SNARE:  - TUBULAR ADENOMA.  - NEGATIVE FOR HIGH-GRADE DYSPLASIA AND MALIGNANCY.  February 13, 2018 pathology: C. ATYPICAL LESION ABOVE ANAL VERGE; COLD SNARE:  - TRADITIONAL SERRATED ADENOMA, FRAGMENTS.  - NEGATIVE FOR HIGH-GRADE DYSPLASIA AND MALIGNANCY.  Assessment    Low posterior rectal polyp not completely removed during endoscopic exam.    Plan    The indication for transanal excision making use of the Tamis device was reviewed.  The patient is very concerned with the stormy time she had after her diverticulitis surgery and subsequent colostomy.  I think the likelihood of injury  is low as the lesion is ideally located posteriorly and fairly close to the sphincter (within 7 cm).  She will need to make use of a bowel prep prior to the procedure.  Anticipate a return to work within about 2-4 days after the operation.     HPI, Physical Exam, Assessment and Plan have been scribed under the direction and in the presence of Robert Bellow, MD. Karie Fetch, RN  I have completed the exam and reviewed the above documentation for accuracy and completeness.  I agree with the above.  Haematologist has been used and any errors in dictation or transcription are unintentional.  Hervey Ard, M.D., F.A.C.S.  Forest Gleason Alexie Samson 10/03/2018, 8:40 PM

## 2018-10-03 NOTE — Patient Instructions (Addendum)
The patient is aware to call back for any questions or new concerns.   transanal excision rectal polyp on 10-27-18.

## 2018-10-16 ENCOUNTER — Encounter
Admission: RE | Admit: 2018-10-16 | Discharge: 2018-10-16 | Disposition: A | Discharge status: Home / Self Care | Payer: 59 | Source: Ambulatory Visit | Attending: General Surgery | Admitting: General Surgery

## 2018-10-16 ENCOUNTER — Other Ambulatory Visit: Payer: Self-pay

## 2018-10-16 DIAGNOSIS — Z01818 Encounter for other preprocedural examination: Secondary | ICD-10-CM | POA: Diagnosis not present

## 2018-10-16 DIAGNOSIS — G4733 Obstructive sleep apnea (adult) (pediatric): Secondary | ICD-10-CM | POA: Diagnosis not present

## 2018-10-16 DIAGNOSIS — I10 Essential (primary) hypertension: Secondary | ICD-10-CM | POA: Insufficient documentation

## 2018-10-16 LAB — CBC
HEMATOCRIT: 45.1 % (ref 36.0–46.0)
HEMOGLOBIN: 14.9 g/dL (ref 12.0–15.0)
MCH: 31 pg (ref 26.0–34.0)
MCHC: 33 g/dL (ref 30.0–36.0)
MCV: 93.8 fL (ref 80.0–100.0)
NRBC: 0 % (ref 0.0–0.2)
Platelets: 231 10*3/uL (ref 150–400)
RBC: 4.81 MIL/uL (ref 3.87–5.11)
RDW: 12.3 % (ref 11.5–15.5)
WBC: 8.1 10*3/uL (ref 4.0–10.5)

## 2018-10-16 LAB — BASIC METABOLIC PANEL
ANION GAP: 7 (ref 5–15)
BUN: 15 mg/dL (ref 8–23)
CHLORIDE: 101 mmol/L (ref 98–111)
CO2: 31 mmol/L (ref 22–32)
Calcium: 9.9 mg/dL (ref 8.9–10.3)
Creatinine, Ser: 0.86 mg/dL (ref 0.44–1.00)
GFR calc non Af Amer: >60 mL/min (ref >60)
Glucose, Bld: 119 mg/dL — ABNORMAL HIGH (ref 70–99)
POTASSIUM: 3.7 mmol/L (ref 3.5–5.1)
Sodium: 139 mmol/L (ref 135–145)

## 2018-10-16 NOTE — Patient Instructions (Signed)
Your procedure is scheduled on: Friday 10/27/2018 Report to [place]. To find out your arrival time please call [phone] between 1PM - 3PM on Thursday 10/26/2018.  Remember: Instructions that are not followed completely may result in serious medical risk, up to and including death, or upon the discretion of your surgeon and anesthesiologist your surgery may need to be rescheduled.     _X__ 1. FOLLOW DR BYRNETT'S INSTRUCTIONS REGARDING DIET PRIOR TO SURGERY   __X__2.  On the morning of surgery brush your teeth with toothpaste and water, you                 may rinse your mouth with mouthwash if you wish.  Do not swallow any   toothpaste of mouthwash.     _X__ 3.  No Alcohol for 24 hours before or after surgery.   _X__ 4.  Do Not Smoke or use e-cigarettes For 24 Hours Prior to Your Surgery.                 Do not use any chewable tobacco products for at least 6 hours prior to                 surgery.  ____  5.  Bring all medications with you on the day of surgery if instructed.   __X__  6.  Notify your doctor if there is any change in your medical condition      (cold, fever, infections).     Do not wear jewelry, make-up, hairpins, clips or nail polish. Do not wear lotions, powders, or perfumes.  Do not shave 48 hours prior to surgery. Men may shave face and neck. Do not bring valuables to the hospital.    [facility] is not responsible for any belongings or valuables.  Contacts, dentures/partials or body piercings may not be worn into surgery. Bring a case for your contacts, glasses or hearing aids, a denture cup will be supplied. Leave your suitcase in the car. After surgery it may be brought to your room. For patients admitted to the hospital, discharge time is determined by your treatment team.   Patients discharged the day of surgery will not be allowed to drive home.   Please read over the following fact sheets that  you were given:   MRSA Information  __X__ Take these medicines the morning of surgery with A SIP OF WATER:    1. esomeprazole ([place]  2. DULoxetine (CYMBALTA)   3.   4.  5.  6.  ____ Fleet Enema (as directed)   ____ Use CHG Soap/SAGE wipes as directed  ____ Use inhalers on the day of surgery  ____ Stop metformin/Janumet/Farxiga 2 days prior to surgery    ____ Take 1/2 of usual insulin dose the night before surgery. No insulin the morning          of surgery.   ____ Stop Blood Thinners Coumadin/Plavix/Xarelto/Pleta/Pradaxa/Eliquis/Effient/Aspirin  on   Or contact your Surgeon, Cardiologist or Medical Doctor regarding  ability to stop your blood thinners  __X__ Stop Anti-inflammatories 7 days before surgery such as Advil, Ibuprofen, Motrin,  BC or Goodies Powder, Naprosyn, Naproxen, Aleve, Aspirin    __X__ Stop all herbal supplements, fish oil or vitamin E until after surgery.  ([place], [place])  __X__ Bring C-Pap to the hospital.

## 2018-10-19 ENCOUNTER — Telehealth: Payer: Self-pay | Admitting: *Deleted

## 2018-10-19 NOTE — Telephone Encounter (Signed)
Patient contacted because she cancelled an appointment that was scheduled for 10-23-18 with Dr. Celine Ahr.   It appears that this appointment was made in error. Patient has already seen Dr. Bary Castilla and is scheduled for surgery next week.

## 2018-10-23 ENCOUNTER — Inpatient Hospital Stay: Payer: 59 | Admitting: General Surgery

## 2018-10-26 ENCOUNTER — Telehealth: Payer: Self-pay | Admitting: *Deleted

## 2018-10-26 NOTE — Telephone Encounter (Signed)
Patient called the office and states that she has a head cold. She is unsure if she has a fever as she does not have a thermometer.  The patient states she can't breathe out of one nostril and she feels like her head is under water.   Patient would prefer that we reschedule surgery to a later date due to this.   Surgery to be rescheduled from 10-27-18 to 11-10-18.  Patient aware instructions will be the same and she will need to call on 11-09-18 to get her new arrival time. She verbalizes understanding.

## 2018-11-10 ENCOUNTER — Ambulatory Visit
Admission: RE | Admit: 2018-11-10 | Discharge: 2018-11-10 | Disposition: A | Discharge status: Home / Self Care | Payer: 59 | Attending: General Surgery | Admitting: General Surgery

## 2018-11-10 ENCOUNTER — Encounter
Admission: RE | Disposition: A | Discharge status: Home / Self Care | Payer: Self-pay | Source: Home / Self Care | Attending: General Surgery

## 2018-11-10 ENCOUNTER — Ambulatory Visit: Payer: 59 | Admitting: Anesthesiology

## 2018-11-10 ENCOUNTER — Other Ambulatory Visit: Payer: Self-pay

## 2018-11-10 DIAGNOSIS — Z87891 Personal history of nicotine dependence: Secondary | ICD-10-CM | POA: Insufficient documentation

## 2018-11-10 DIAGNOSIS — Z86010 Personal history of colonic polyps: Secondary | ICD-10-CM | POA: Diagnosis not present

## 2018-11-10 DIAGNOSIS — I341 Nonrheumatic mitral (valve) prolapse: Secondary | ICD-10-CM | POA: Insufficient documentation

## 2018-11-10 DIAGNOSIS — Z882 Allergy status to sulfonamides status: Secondary | ICD-10-CM | POA: Insufficient documentation

## 2018-11-10 DIAGNOSIS — Z881 Allergy status to other antibiotic agents status: Secondary | ICD-10-CM | POA: Insufficient documentation

## 2018-11-10 DIAGNOSIS — I739 Peripheral vascular disease, unspecified: Secondary | ICD-10-CM | POA: Diagnosis not present

## 2018-11-10 DIAGNOSIS — J45909 Unspecified asthma, uncomplicated: Secondary | ICD-10-CM | POA: Diagnosis not present

## 2018-11-10 DIAGNOSIS — M1990 Unspecified osteoarthritis, unspecified site: Secondary | ICD-10-CM | POA: Diagnosis not present

## 2018-11-10 DIAGNOSIS — K219 Gastro-esophageal reflux disease without esophagitis: Secondary | ICD-10-CM | POA: Diagnosis not present

## 2018-11-10 DIAGNOSIS — Z91048 Other nonmedicinal substance allergy status: Secondary | ICD-10-CM | POA: Insufficient documentation

## 2018-11-10 DIAGNOSIS — Z885 Allergy status to narcotic agent status: Secondary | ICD-10-CM | POA: Insufficient documentation

## 2018-11-10 DIAGNOSIS — Z90710 Acquired absence of both cervix and uterus: Secondary | ICD-10-CM | POA: Diagnosis not present

## 2018-11-10 DIAGNOSIS — Z86718 Personal history of other venous thrombosis and embolism: Secondary | ICD-10-CM | POA: Insufficient documentation

## 2018-11-10 DIAGNOSIS — K621 Rectal polyp: Secondary | ICD-10-CM | POA: Insufficient documentation

## 2018-11-10 DIAGNOSIS — Z880 Allergy status to penicillin: Secondary | ICD-10-CM | POA: Diagnosis not present

## 2018-11-10 DIAGNOSIS — Z79899 Other long term (current) drug therapy: Secondary | ICD-10-CM | POA: Insufficient documentation

## 2018-11-10 DIAGNOSIS — I10 Essential (primary) hypertension: Secondary | ICD-10-CM | POA: Insufficient documentation

## 2018-11-10 DIAGNOSIS — G4730 Sleep apnea, unspecified: Secondary | ICD-10-CM | POA: Diagnosis not present

## 2018-11-10 DIAGNOSIS — Z888 Allergy status to other drugs, medicaments and biological substances status: Secondary | ICD-10-CM | POA: Insufficient documentation

## 2018-11-10 DIAGNOSIS — Z886 Allergy status to analgesic agent status: Secondary | ICD-10-CM | POA: Diagnosis not present

## 2018-11-10 DIAGNOSIS — Z6841 Body Mass Index (BMI) 40.0 and over, adult: Secondary | ICD-10-CM | POA: Insufficient documentation

## 2018-11-10 DIAGNOSIS — Z9049 Acquired absence of other specified parts of digestive tract: Secondary | ICD-10-CM | POA: Insufficient documentation

## 2018-11-10 DIAGNOSIS — D127 Benign neoplasm of rectosigmoid junction: Secondary | ICD-10-CM | POA: Diagnosis not present

## 2018-11-10 HISTORY — PX: TRANSANAL EXCISION OF RECTAL MASS: SHX6134

## 2018-11-10 SURGERY — EXCISION, MASS, RECTUM, ANAL APPROACH
Anesthesia: General

## 2018-11-10 MED ORDER — DEXAMETHASONE SODIUM PHOSPHATE 10 MG/ML IJ SOLN
INTRAMUSCULAR | Status: DC | PRN
  Administered 2018-11-10: 10 mg via INTRAVENOUS

## 2018-11-10 MED ORDER — HYDROCODONE-ACETAMINOPHEN 5-325 MG PO TABS: 1.0000 | ORAL_TABLET | ORAL | 0 refills | Status: DC | PRN

## 2018-11-10 MED ORDER — FENTANYL CITRATE (PF) 100 MCG/2ML IJ SOLN: 25.0000 ug | INTRAMUSCULAR | Status: DC | PRN

## 2018-11-10 MED ORDER — MIDAZOLAM HCL 2 MG/2ML IJ SOLN
INTRAMUSCULAR | Status: AC
  Filled 2018-11-10: qty 2

## 2018-11-10 MED ORDER — PROPOFOL 10 MG/ML IV BOLUS
INTRAVENOUS | Status: AC
  Filled 2018-11-10: qty 40

## 2018-11-10 MED ORDER — ONDANSETRON HCL 4 MG/2ML IJ SOLN
INTRAMUSCULAR | Status: DC | PRN
  Administered 2018-11-10: 4 mg via INTRAVENOUS

## 2018-11-10 MED ORDER — ROCURONIUM BROMIDE 100 MG/10ML IV SOLN
INTRAVENOUS | Status: DC | PRN
  Administered 2018-11-10: 50 mg via INTRAVENOUS

## 2018-11-10 MED ORDER — ROCURONIUM BROMIDE 50 MG/5ML IV SOLN
INTRAVENOUS | Status: AC
  Filled 2018-11-10: qty 2

## 2018-11-10 MED ORDER — EPINEPHRINE PF 1 MG/ML IJ SOLN
INTRAMUSCULAR | Status: AC
  Filled 2018-11-10: qty 1

## 2018-11-10 MED ORDER — ACETAMINOPHEN 10 MG/ML IV SOLN
INTRAVENOUS | Status: DC | PRN
  Administered 2018-11-10: 1000 mg via INTRAVENOUS

## 2018-11-10 MED ORDER — LIDOCAINE HCL (CARDIAC) PF 100 MG/5ML IV SOSY
PREFILLED_SYRINGE | INTRAVENOUS | Status: DC | PRN
  Administered 2018-11-10: 100 mg via INTRAVENOUS

## 2018-11-10 MED ORDER — SUGAMMADEX SODIUM 200 MG/2ML IV SOLN
INTRAVENOUS | Status: DC | PRN
  Administered 2018-11-10: 20 mg via INTRAVENOUS

## 2018-11-10 MED ORDER — ONDANSETRON HCL 4 MG/2ML IJ SOLN: 4.0000 mg | Freq: Once | INTRAMUSCULAR | Status: DC | PRN

## 2018-11-10 MED ORDER — ACETAMINOPHEN 10 MG/ML IV SOLN
INTRAVENOUS | Status: AC
  Filled 2018-11-10: qty 100

## 2018-11-10 MED ORDER — LACTATED RINGERS IV SOLN
INTRAVENOUS | Status: DC
  Administered 2018-11-10: 11:00:00 via INTRAVENOUS

## 2018-11-10 MED ORDER — BUPIVACAINE HCL (PF) 0.5 % IJ SOLN
INTRAMUSCULAR | Status: AC
  Filled 2018-11-10: qty 30

## 2018-11-10 MED ORDER — FENTANYL CITRATE (PF) 100 MCG/2ML IJ SOLN
INTRAMUSCULAR | Status: DC | PRN
  Administered 2018-11-10: 100 ug via INTRAVENOUS
  Administered 2018-11-10: 50 ug via INTRAVENOUS

## 2018-11-10 MED ORDER — FENTANYL CITRATE (PF) 250 MCG/5ML IJ SOLN
INTRAMUSCULAR | Status: AC
  Filled 2018-11-10: qty 5

## 2018-11-10 MED ORDER — BUPIVACAINE-EPINEPHRINE 0.5% -1:200000 IJ SOLN
INTRAMUSCULAR | Status: DC | PRN
  Administered 2018-11-10: 21 mL

## 2018-11-10 MED ORDER — PROPOFOL 10 MG/ML IV BOLUS
INTRAVENOUS | Status: DC | PRN
  Administered 2018-11-10: 140 mg via INTRAVENOUS

## 2018-11-10 MED ORDER — SODIUM CHLORIDE 0.9 % IV SOLN
1.0000 g | INTRAVENOUS | Status: AC
  Administered 2018-11-10: 1 g via INTRAVENOUS
  Filled 2018-11-10: qty 1

## 2018-11-10 MED ORDER — MIDAZOLAM HCL 2 MG/2ML IJ SOLN
INTRAMUSCULAR | Status: DC | PRN
  Administered 2018-11-10: 2 mg via INTRAVENOUS

## 2018-11-10 SURGICAL SUPPLY — 35 items
BAG COUNTER SPONGE EZ (MISCELLANEOUS) ×2 IMPLANT
BAG SPNG 4X4 CLR HAZ (MISCELLANEOUS) ×1
BRIEF STRETCH MATERNITY 2XLG (MISCELLANEOUS) ×3 IMPLANT
CANISTER SUCT 1200ML W/VALVE (MISCELLANEOUS) ×3 IMPLANT
COUNTER SPONGE BAG EZ (MISCELLANEOUS) ×1
COVER WAND RF STERILE (DRAPES) IMPLANT
DRAPE LAPAROTOMY 100X77 ABD (DRAPES) ×3 IMPLANT
DRAPE LEGGINS SURG 28X43 STRL (DRAPES) ×3 IMPLANT
DRAPE UNDER BUTTOCK W/FLU (DRAPES) ×3 IMPLANT
DRSG GAUZE PETRO 6X36 STRIP ST (GAUZE/BANDAGES/DRESSINGS) ×3 IMPLANT
ELECT CAUTERY BLADE TIP 2.5 (TIP) ×3
ELECT REM PT RETURN 9FT ADLT (ELECTROSURGICAL)
ELECTRODE CAUTERY BLDE TIP 2.5 (TIP) ×1 IMPLANT
ELECTRODE REM PT RTRN 9FT ADLT (ELECTROSURGICAL) IMPLANT
FILTER PURE VAC YLPA (MISCELLANEOUS) IMPLANT
FLTR PURE VAC YLPA (MISCELLANEOUS)
GLOVE BIO SURGEON STRL SZ7.5 (GLOVE) ×9 IMPLANT
GLOVE INDICATOR 8.0 STRL GRN (GLOVE) ×9 IMPLANT
GOWN STRL REUS W/ TWL LRG LVL3 (GOWN DISPOSABLE) ×2 IMPLANT
GOWN STRL REUS W/TWL LRG LVL3 (GOWN DISPOSABLE) ×6
LABEL OR SOLS (LABEL) ×3 IMPLANT
NEEDLE HYPO 22GX1.5 SAFETY (NEEDLE) IMPLANT
NEEDLE HYPO 25X1 1.5 SAFETY (NEEDLE) IMPLANT
PACK BASIN MINOR ARMC (MISCELLANEOUS) ×3 IMPLANT
PAD OB MATERNITY 4.3X12.25 (PERSONAL CARE ITEMS) ×3 IMPLANT
PAD PREP 24X41 OB/GYN DISP (PERSONAL CARE ITEMS) ×3 IMPLANT
STRAP SAFETY 5IN WIDE (MISCELLANEOUS) ×3 IMPLANT
SUCT SIGMOIDOSCOPE TIP 18 W/TU (SUCTIONS) ×3 IMPLANT
SURGILUBE 2OZ TUBE FLIPTOP (MISCELLANEOUS) ×3 IMPLANT
SUT CHROMIC 3 0 SH 27 (SUTURE) ×3 IMPLANT
SUT SILK 0 CT 1 30 (SUTURE) ×3 IMPLANT
SUT VIC AB 3-0 SH 27 (SUTURE) ×3
SUT VIC AB 3-0 SH 27X BRD (SUTURE) ×1 IMPLANT
SYR 10ML LL (SYRINGE) IMPLANT
TUBING SMOKE EVAC 6FT (TUBING) IMPLANT

## 2018-11-10 NOTE — Anesthesia Procedure Notes (Signed)
Procedure Name: Intubation Date/Time: 11/10/2018 11:37 AM Performed by: Lorie Apley, CRNA Pre-anesthesia Checklist: Patient identified, Patient being monitored, Timeout performed, Emergency Drugs available and Suction available Patient Re-evaluated:Patient Re-evaluated prior to induction Oxygen Delivery Method: Circle system utilized Preoxygenation: Pre-oxygenation with 100% oxygen Induction Type: IV induction Ventilation: Mask ventilation without difficulty Laryngoscope Size: Mac and 3 Grade View: Grade II Tube type: Oral Tube size: 7.0 mm Number of attempts: 1 Airway Equipment and Method: Stylet Placement Confirmation: ETT inserted through vocal cords under direct vision,  positive ETCO2 and breath sounds checked- equal and bilateral Secured at: 21 cm Tube secured with: Tape Dental Injury: Teeth and Oropharynx as per pre-operative assessment

## 2018-11-10 NOTE — Anesthesia Post-op Follow-up Note (Signed)
Anesthesia QCDR form completed.        

## 2018-11-10 NOTE — H&P (Signed)
Kelly Perry [id number] 1950/12/20     HPI: 68 year old woman initially identified with a rectal polyp in June 2019, follow-up exam in September showed could not be resected.  Presents today for transanal excision.  Sinus symptoms present in the last 2 weeks have significantly improved.  Tolerated bowel prep well.  Medications Prior to Admission  Medication Sig Dispense Refill Last Dose  . acetaminophen (TYLENOL) 650 MG CR tablet Take 1,300 mg by mouth every 8 (eight) hours as needed for pain.   11/09/2018 at Unknown time  . bisoprolol-hydrochlorothiazide (ZIAC) 2.5-6.25 MG per tablet Take 1 tablet by mouth every morning.    11/10/2018 at n  . BOSWELLIA SERRATA PO Take by mouth.   Past Week at Unknown time  . Calcium Carb-Cholecalciferol (CALCIUM 600/VITAMIN D3 PO) Take 1 tablet by mouth daily.   11/09/2018 at Unknown time  . DULoxetine (CYMBALTA) 60 MG capsule Take 60 mg by mouth daily.    11/09/2018 at Unknown time  . esomeprazole (NEXIUM) 40 MG capsule Take 40 mg by mouth daily.   11/10/2018 at Unknown time  . furosemide (LASIX) 20 MG tablet Take 40 mg by mouth daily.    11/09/2018 at Unknown time  . metaxalone (SKELAXIN) 800 MG tablet Take 800 mg by mouth 2 (two) times daily.    11/09/2018 at Unknown time  . OVER THE COUNTER MEDICATION Apply 1 application topically every 6 (six) hours as needed (for pain). Toprisin Topical Cream   Taking  . OVER THE COUNTER MEDICATION Take 2 capsules by mouth 3 (three) times daily. Turmeric Rose Mary Leaf Dogwood Bark Supplement   Past Week at Unknown time  . polyethylene glycol powder (GLYCOLAX/MIRALAX) powder Mix whole container with 64 ounces of clear liquids, No Red liquids. 255 g 0 11/09/2018 at Unknown time  . potassium chloride (K-DUR) 10 MEQ tablet Take 10 mEq by mouth daily.    11/09/2018 at Unknown time  . telmisartan (MICARDIS) 40 MG tablet Take 40 mg by mouth daily.   11/10/2018 at n  . OVER THE COUNTER MEDICATION 1-2 tablets daily. ARTHROZENE       Allergies  Allergen Reactions  . Morphine And Related Other (See Comments)    Elephant on chest After IV push med delivered can tolerate slow delivery per pt 10/16/2018  . Novocain [Procaine] Other (See Comments)    Blackout (as a child)  . Aspirin Other (See Comments)    Ringing in the ears  . Doxycycline Hyclate Diarrhea  . Levaquin [Levofloxacin In D5w] Diarrhea  . Penicillins Rash  . Sulfur Other (See Comments)    sweating  . Tape Other (See Comments)    USE PAPER TAPE   Past Medical History:  Diagnosis Date  . Arthritis   . Asthma    wheezing with URI- no inhalers day to day  . Cervical disc herniation   . Colon adenomas   . Deep vein thrombosis (DVT) during current hospitalization ([place])   . Easy bruising   . GERD (gastroesophageal reflux disease)   . Hypertension   . Mitral valve prolapse    eccho 7/13 chart  . Peripheral vascular disease ([place]) 7/12   DVT  left leg  post op- coumadin x 6 months  . Sleep apnea    Past Surgical History:  Procedure Laterality Date  . ABDOMINAL HYSTERECTOMY  2007   total  . CERVICAL SPINE SURGERY    . CHOLECYSTECTOMY    . COLON SURGERY  06/30/10   colectomy, Dr Pat Patrick  .  COLONOSCOPY WITH PROPOFOL N/A 02/13/2018   Procedure: COLONOSCOPY WITH PROPOFOL;  Surgeon: Lollie Sails, MD;  Location: [facility] ENDOSCOPY;  Service: Endoscopy;  Laterality: N/A;  . COLOSTOMY  2011   Dr Pat Patrick  . COLOSTOMY TAKEDOWN  02/2011   Dr Pat Patrick  . FLEXIBLE SIGMOIDOSCOPY N/A 06/05/2018   Procedure: FLEXIBLE SIGMOIDOSCOPY;  Surgeon: Lollie Sails, MD;  Location: [facility] ENDOSCOPY;  Service: Endoscopy;  Laterality: N/A;  . HERNIA REPAIR    . INSERTION OF MESH N/A 02/26/2013   Procedure: INSERTION OF MESH;  Surgeon: Imogene Burn. Georgette Dover, MD;  Location: [facility] ORS;  Service: General;  Laterality: N/A;  . LYSIS OF ADHESION N/A 02/26/2013   Procedure: LYSIS OF ADHESION;  Surgeon: Imogene Burn. Georgette Dover, MD;  Location: [facility] ORS;  Service: General;  Laterality: N/A;  . SPINE SURGERY   2010   cervical neck fusion/  LIMITED MOBILITY  . TONSILLECTOMY    . VENTRAL HERNIA REPAIR N/A 02/26/2013   Procedure: HERNIA REPAIR VENTRAL ADULT;  Surgeon: Imogene Burn. Georgette Dover, MD;  Location: [facility] ORS;  Service: General;  Laterality: N/A;   Social History   Socioeconomic History  . Marital status: Divorced    Spouse name: Not on file  . Number of children: Not on file  . Years of education: Not on file  . Highest education level: Not on file  Occupational History  . Not on file  Social Needs  . Financial resource strain: Not on file  . Food insecurity:    Worry: Not on file    Inability: Not on file  . Transportation needs:    Medical: Not on file    Non-medical: Not on file  Tobacco Use  . Smoking status: Former Smoker    Packs/day: 1.00    Years: 10.00    Pack years: 10.00    Types: Cigarettes    Last attempt to quit: 01/16/1985    Years since quitting: 33.8  . Smokeless tobacco: Never Used  Substance and Sexual Activity  . Alcohol use: Yes    Comment: occ  . Drug use: No  . Sexual activity: Not on file  Lifestyle  . Physical activity:    Days per week: Not on file    Minutes per session: Not on file  . Stress: Not on file  Relationships  . Social connections:    Talks on phone: Not on file    Gets together: Not on file    Attends religious service: Not on file    Active member of club or organization: Not on file    Attends meetings of clubs or organizations: Not on file    Relationship status: Not on file  . Intimate partner violence:    Fear of current or ex partner: Not on file    Emotionally abused: Not on file    Physically abused: Not on file    Forced sexual activity: Not on file  Other Topics Concern  . Not on file  Social History Narrative  . Not on file   Social History   Social History Narrative  . Not on file     ROS: Negative.     PE: HEENT: Negative. Lungs: Clear. Cardio: RR.  Assessment/Plan:  Proceed with planned trans-anal  excision of a low rectal polyp.  Forest Gleason  11/10/2018

## 2018-11-10 NOTE — Anesthesia Preprocedure Evaluation (Addendum)
Anesthesia Evaluation  Patient identified by MRN, date of birth, ID band Patient awake    Reviewed: Allergy & Precautions, H&P , NPO status , Patient's Chart, lab work & pertinent test results, reviewed documented beta blocker date and time   Airway Mallampati: III  TM Distance: >3 FB Neck ROM: full    Dental  (+) Dental Advidsory Given, Teeth Intact   Pulmonary neg shortness of breath, asthma , sleep apnea and Continuous Positive Airway Pressure Ventilation , neg COPD, neg recent URI, former smoker,           Cardiovascular Exercise Tolerance: Good hypertension, (-) angina+ Peripheral Vascular Disease and + DVT (in the past)  (-) CAD, (-) Past MI, (-) Cardiac Stents and (-) CABG (-) dysrhythmias + Valvular Problems/Murmurs MVP      Neuro/Psych  Neuromuscular disease negative psych ROS   GI/Hepatic Neg liver ROS, GERD  ,  Endo/Other  diabetes (borderline)Morbid obesity  Renal/GU negative Renal ROS  negative genitourinary   Musculoskeletal  (+) Arthritis ,   Abdominal   Peds  Hematology negative hematology ROS (+)   Anesthesia Other Findings Past Medical History: No date: Arthritis No date: Asthma     Comment:  wheezing with URI- no inhalers day to day No date: Cervical disc herniation No date: Colon adenomas No date: Deep vein thrombosis (DVT) during current hospitalization  ([place]) No date: Easy bruising No date: GERD (gastroesophageal reflux disease) No date: Hypertension No date: Mitral valve prolapse     Comment:  eccho 7/13 chart 7/12: Peripheral vascular disease ([place])     Comment:  DVT  left leg  post op- coumadin x 6 months No date: Sleep apnea   Reproductive/Obstetrics negative OB ROS                             Anesthesia Physical  Anesthesia Plan  ASA: III  Anesthesia Plan: General   Post-op Pain Management:    Induction: Intravenous  PONV Risk Score and Plan:  3  Airway Management Planned: Oral ETT  Additional Equipment:   Intra-op Plan:   Post-operative Plan: Extubation in OR  Informed Consent: I have reviewed the patients History and Physical, chart, labs and discussed the procedure including the risks, benefits and alternatives for the proposed anesthesia with the patient or authorized representative who has indicated his/her understanding and acceptance.     Dental Advisory Given  Plan Discussed with: Anesthesiologist, CRNA and Surgeon  Anesthesia Plan Comments:        Anesthesia Quick Evaluation

## 2018-11-10 NOTE — Progress Notes (Signed)
Pt's packing was removed at 1310 per Dr. Dwyane Luo order.

## 2018-11-10 NOTE — Op Note (Signed)
Preoperative diagnosis: Rectal polyp not amenable to endoscopic resection.  Postoperative diagnosis: Same.  Operative procedure: Exam under anesthesia, sigmoidoscopy, excision of low rectal polyp by transanal technique.  Operating Surgeon: Charlie Pitter, MD.  Anesthesia: General endotracheal, 0.5% Marcaine with 1-200,000's of epinephrine, 30 cc.  Estimated blood loss: 10 cc.  Clinical note: This 68 year old woman underwent a colonoscopy in June 2019 at that time a low rectal polyp was identified.  Repeat flexible sigmoidoscopy in September 2019 showed to be persistent.  Pathology showed a tubular adenoma without atypia.  She is felt to be a candidate for transanal excision.  The patient had made use of a bowel prep as requested and received parenteral antibiotics.  Operative note: After the induction of general endotracheal anesthesia the patient was placed in dorsolithotomy position and padded appropriately.  SCD stockings for DVT prevention.  The perineum was cleansed with Betadine solution and draped.  Digital exam really did not show dominant polyp is noted at the time of her October 2019 exam.  The rigid sigmoidoscope was advanced to 20 cm and the only finding was a small 1 cm area between the previously placed Niger ink dye at the very base of the rectum mucosa abutting the anal mucosa.  The anoscope was placed and local anesthesia infiltrated to minimize bleeding.  Making use electrocautery and staying approximately 5 mm away from the edge of the visible polypoid tissue this was resected, orientated and placed in formalin for routine histology.  The mucosal defect (the circular muscle preserved) was closed with interrupted 3-0 Vicryl figure-of-eight sutures.  A Vaseline gauze pack was placed for removal in the recovery room at 1:10 PM.  A peri-pad was placed.  The patient tolerated the procedure well and was taken recovery room in stable condition.

## 2018-11-10 NOTE — Discharge Instructions (Signed)

## 2018-11-10 NOTE — Transfer of Care (Signed)
Immediate Anesthesia Transfer of Care Note  Patient: Rumaysa A Calvert  Procedure(s) Performed: TRANSANAL EXCISION OF RECTAL POLYP (N/A )  Patient Location: PACU  Anesthesia Type:General  Level of Consciousness: awake, alert  and oriented  Airway & Oxygen Therapy: Patient Spontanous Breathing and Patient connected to face mask oxygen  Post-op Assessment: Report given to RN and Post -op Vital signs reviewed and stable  Post vital signs: Reviewed and stable  Last Vitals:  Vitals Value Taken Time  BP 140/66 11/10/2018 12:42 PM  Temp    Pulse 102 11/10/2018 12:42 PM  Resp 13 11/10/2018 12:42 PM  SpO2 97 % 11/10/2018 12:42 PM  Vitals shown include unvalidated device data.  Last Pain:  Vitals:   11/10/18 1102  TempSrc: Oral  PainSc: 0-No pain         Complications: No apparent anesthesia complications

## 2018-11-12 ENCOUNTER — Encounter: Payer: Self-pay | Admitting: General Surgery

## 2018-11-14 ENCOUNTER — Telehealth: Payer: Self-pay | Admitting: General Surgery

## 2018-11-14 LAB — SURGICAL PATHOLOGY

## 2018-11-14 NOTE — Anesthesia Postprocedure Evaluation (Signed)
Anesthesia Post Note  Patient: Kelly Perry  Procedure(s) Performed: TRANSANAL EXCISION OF RECTAL POLYP (N/A )  Patient location during evaluation: PACU Anesthesia Type: General Level of consciousness: awake and alert Pain management: pain level controlled Vital Signs Assessment: post-procedure vital signs reviewed and stable Respiratory status: spontaneous breathing, nonlabored ventilation and respiratory function stable Cardiovascular status: blood pressure returned to baseline and stable Postop Assessment: no apparent nausea or vomiting Anesthetic complications: no     Last Vitals:  Vitals:   11/10/18 1351 11/10/18 1438  BP: 134/73 132/78  Pulse: 90 88  Resp: 16 16  Temp: (!) 36.3 C   SpO2: 94% 95%    Last Pain:  Vitals:   11/10/18 1438  TempSrc:   PainSc: 0-No pain                 Durenda Hurt

## 2018-11-14 NOTE — Telephone Encounter (Signed)
Notified path was fine. No dysplasia. Feeling well.  Will f/u 11/23/18 as scheduled.

## 2018-11-23 ENCOUNTER — Encounter: Payer: Self-pay | Admitting: General Surgery

## 2018-11-23 ENCOUNTER — Other Ambulatory Visit: Payer: Self-pay

## 2018-11-23 ENCOUNTER — Ambulatory Visit (INDEPENDENT_AMBULATORY_CARE_PROVIDER_SITE_OTHER): Payer: 59 | Admitting: General Surgery

## 2018-11-23 VITALS — BP 114/77 | HR 88 | Temp 97.7°F | Resp 14 | Ht 64.0 in | Wt 251.0 lb

## 2018-11-23 DIAGNOSIS — K621 Rectal polyp: Secondary | ICD-10-CM

## 2018-11-23 MED ORDER — HYDROCORTISONE 2.5 % RE CREA: 1.0000 "application " | TOPICAL_CREAM | Freq: Two times a day (BID) | RECTAL | 0 refills | Status: DC

## 2018-11-23 NOTE — Patient Instructions (Signed)
The patient is aware to call back for any questions or new concerns.  

## 2018-11-23 NOTE — Progress Notes (Signed)
Patient ID: Kelly Perry, female   DOB: 10-24-1950, 68 y.o.   MRN: [id number]  Chief Complaint  Patient presents with  . Routine Post Op     post op TRANSANAL EXCISION OF RECTAL POLYP  11/10/2018    HPI Kelly Perry is a 68 y.o. female.  Here for postoperative excision rectal polyp. She feels like she has an external hemorrhoid with some bleeding on the tissue. She has been using the tucks pads and it has improved.   HPI  Past Medical History:  Diagnosis Date  . Arthritis   . Asthma    wheezing with URI- no inhalers day to day  . Cervical disc herniation   . Colon adenomas   . Deep vein thrombosis (DVT) during current hospitalization ([place])   . Easy bruising   . GERD (gastroesophageal reflux disease)   . Hypertension   . Mitral valve prolapse    eccho 7/13 chart  . Peripheral vascular disease ([place]) 7/12   DVT  left leg  post op- coumadin x 6 months  . Sleep apnea     Past Surgical History:  Procedure Laterality Date  . ABDOMINAL HYSTERECTOMY  2007   total  . CERVICAL SPINE SURGERY    . CHOLECYSTECTOMY    . COLON SURGERY  06/30/10   colectomy, Dr Pat Patrick  . COLONOSCOPY WITH PROPOFOL N/A 02/13/2018   Procedure: COLONOSCOPY WITH PROPOFOL;  Surgeon: Lollie Sails, MD;  Location: [facility] ENDOSCOPY;  Service: Endoscopy;  Laterality: N/A;  . COLOSTOMY  2011   Dr Pat Patrick  . COLOSTOMY TAKEDOWN  02/2011   Dr Pat Patrick  . FLEXIBLE SIGMOIDOSCOPY N/A 06/05/2018   Procedure: FLEXIBLE SIGMOIDOSCOPY;  Surgeon: Lollie Sails, MD;  Location: [facility] ENDOSCOPY;  Service: Endoscopy;  Laterality: N/A;  . HERNIA REPAIR    . INSERTION OF MESH N/A 02/26/2013   Procedure: INSERTION OF MESH;  Surgeon: Imogene Burn. Georgette Dover, MD;  Location: [facility] ORS;  Service: General;  Laterality: N/A;  . LYSIS OF ADHESION N/A 02/26/2013   Procedure: LYSIS OF ADHESION;  Surgeon: Imogene Burn. Georgette Dover, MD;  Location: [facility] ORS;  Service: General;  Laterality: N/A;  . SPINE SURGERY  2010   cervical neck fusion/  LIMITED  MOBILITY  . TONSILLECTOMY    . TRANSANAL EXCISION OF RECTAL MASS N/A 11/10/2018   Procedure: TRANSANAL EXCISION OF RECTAL POLYP;  Surgeon: Robert Bellow, MD;  Location: [facility] ORS;  Service: General;  Laterality: N/A;  . VENTRAL HERNIA REPAIR N/A 02/26/2013   Procedure: HERNIA REPAIR VENTRAL ADULT;  Surgeon: Imogene Burn. Georgette Dover, MD;  Location: [facility] ORS;  Service: General;  Laterality: N/A;    Family History  Problem Relation Age of Onset  . Arthritis Mother   . Hypertension Mother   . Breast cancer Paternal Aunt 13  . Colon cancer Maternal Grandfather 44  . Colon cancer Cousin        maternal    Social History Social History   Tobacco Use  . Smoking status: Former Smoker    Packs/day: 1.00    Years: 10.00    Pack years: 10.00    Types: Cigarettes    Last attempt to quit: 01/16/1985    Years since quitting: 33.8  . Smokeless tobacco: Never Used  Substance Use Topics  . Alcohol use: Yes    Comment: occ  . Drug use: No    Allergies  Allergen Reactions  . Morphine And Related Other (See Comments)    Elephant on chest After IV  push med delivered can tolerate slow delivery per pt 10/16/2018  . Novocain [Procaine] Other (See Comments)    Blackout (as a child)  . Aspirin Other (See Comments)    Ringing in the ears  . Doxycycline Hyclate Diarrhea  . Levaquin [Levofloxacin In D5w] Diarrhea  . Penicillins Rash  . Sulfur Other (See Comments)    sweating  . Tape Other (See Comments)    USE PAPER TAPE    Current Outpatient Medications  Medication Sig Dispense Refill  . acetaminophen (TYLENOL) 650 MG CR tablet Take 1,300 mg by mouth every 8 (eight) hours as needed for pain.    . bisoprolol-hydrochlorothiazide (ZIAC) 2.5-6.25 MG per tablet Take 1 tablet by mouth every morning.     Marland Kitchen BOSWELLIA SERRATA PO Take by mouth.    . Calcium Carb-Cholecalciferol (CALCIUM 600/VITAMIN D3 PO) Take 1 tablet by mouth daily.    . DULoxetine (CYMBALTA) 60 MG capsule Take 60 mg by mouth daily.      Marland Kitchen esomeprazole (NEXIUM) 40 MG capsule Take 40 mg by mouth daily.    . furosemide (LASIX) 20 MG tablet Take 40 mg by mouth daily.     . metaxalone (SKELAXIN) 800 MG tablet Take 800 mg by mouth 2 (two) times daily.     Marland Kitchen OVER THE COUNTER MEDICATION Apply 1 application topically every 6 (six) hours as needed (for pain). Toprisin Topical Cream    . OVER THE COUNTER MEDICATION Take 2 capsules by mouth 3 (three) times daily. Turmeric Rose Mary Leaf Dogwood Bark Supplement    . OVER THE COUNTER MEDICATION 1-2 tablets daily. ARTHROZENE    . potassium chloride (K-DUR) 10 MEQ tablet Take 10 mEq by mouth daily.     Marland Kitchen telmisartan (MICARDIS) 40 MG tablet Take 40 mg by mouth daily.    . hydrocortisone (ANUSOL-HC) 2.5 % rectal cream Place 1 application rectally 2 (two) times daily. 30 g 0   No current facility-administered medications for this visit.     Review of Systems Review of Systems  Constitutional: Negative.   Respiratory: Negative.   Cardiovascular: Negative.     Blood pressure 114/77, pulse 88, temperature 97.7 F (36.5 C), temperature source Temporal, resp. rate 14, height 5\' 4"  (1.626 m), weight 251 lb (113.9 kg), SpO2 96 %.  Physical Exam Physical Exam Constitutional:      Appearance: Normal appearance.  Genitourinary:   Skin:    General: Skin is warm and dry.  Neurological:     Mental Status: She is alert and oriented to person, place, and time.  Psychiatric:        Mood and Affect: Mood normal.        Behavior: Behavior normal.     Data Reviewed November 10, 2018 transanal excision results:  DIAGNOSIS:  A. RECTUM POLYP, POSTERIOR; TRANSANAL EXCISION:  - ADENOMA WITH COMBINED FEATURES OF TUBULAR ADENOMA AND TRADITIONAL  SERRATED ADENOMA.  - NEGATIVE FOR HIGH-GRADE DYSPLASIA AND MALIGNANCY.  - CARBON BLACK TATTOO PRESENT.  - ALL MARGINS ARE FREE OF DYSPLASIA.   Comment:  The specimen consists of mucosa surfaced by columnar epithelium and  squamous epithelium.  Muscularis propria, partial thickness, is present  in the distal two-thirds of the specimen.  Discussed with Dr. Dicie Beam: Margins free of residual polypoid tissue.  Dysplasia was used in regards to the polyp itself.  No high grade dysplasia. Patient will be a candidate for a f/u colonoscopy with Dr. Gustavo Lah in three years at the outside based on the findings  of two tubular adenomas and one sessile serrated adenoma >10 mm (now resected).   Assessment Mild anal discomfort post excision of low rectal polyp.  Plan  Anusol cream BID along with Tucks pads should provide comfort. The patient reports no difficulty with bowel function.  Follow up as needed  Anticipate a repeat colonoscopy with Dr. Gustavo Lah in 3 years.  The patient is aware to call back for any questions or new concerns.  HPI, assessment, plan and physical exam has been scribed under the direction and in the presence of Robert Bellow, MD. Karie Fetch, RN  I have completed the exam and reviewed the above documentation for accuracy and completeness.  I agree with the above.  Haematologist has been used and any errors in dictation or transcription are unintentional.  Hervey Ard, M.D., F.A.C.S.   Forest Gleason Karlyn Glasco 11/23/2018, 10:32 AM

## 2019-04-26 ENCOUNTER — Other Ambulatory Visit: Payer: Self-pay

## 2019-04-26 DIAGNOSIS — Z20822 Contact with and (suspected) exposure to covid-19: Secondary | ICD-10-CM

## 2019-04-27 LAB — NOVEL CORONAVIRUS, NAA: SARS-CoV-2, NAA: NOT DETECTED

## 2019-05-03 ENCOUNTER — Telehealth: Payer: Self-pay | Admitting: General Practice

## 2019-05-03 NOTE — Telephone Encounter (Signed)
Negative COVID results given. Patient results "NOT Detected." Caller expressed understanding. ° °

## 2019-09-12 ENCOUNTER — Other Ambulatory Visit: Payer: Self-pay | Admitting: Internal Medicine

## 2019-09-12 DIAGNOSIS — Z1231 Encounter for screening mammogram for malignant neoplasm of breast: Secondary | ICD-10-CM

## 2019-10-12 ENCOUNTER — Ambulatory Visit
Admission: RE | Admit: 2019-10-12 | Discharge: 2019-10-12 | Disposition: A | Discharge status: Home / Self Care | Payer: 59 | Source: Ambulatory Visit | Attending: Internal Medicine | Admitting: Internal Medicine

## 2019-10-12 DIAGNOSIS — Z1231 Encounter for screening mammogram for malignant neoplasm of breast: Secondary | ICD-10-CM | POA: Diagnosis present

## 2020-03-06 ENCOUNTER — Encounter: Payer: 59 | Admitting: Obstetrics & Gynecology

## 2020-04-02 ENCOUNTER — Other Ambulatory Visit: Payer: Self-pay

## 2020-04-02 ENCOUNTER — Encounter: Payer: Self-pay | Admitting: Obstetrics & Gynecology

## 2020-04-02 ENCOUNTER — Other Ambulatory Visit (HOSPITAL_COMMUNITY)
Admission: RE | Admit: 2020-04-02 | Discharge: 2020-04-02 | Disposition: A | Discharge status: Home / Self Care | Payer: BC Managed Care – PPO | Source: Ambulatory Visit | Attending: Obstetrics & Gynecology | Admitting: Obstetrics & Gynecology

## 2020-04-02 ENCOUNTER — Ambulatory Visit (INDEPENDENT_AMBULATORY_CARE_PROVIDER_SITE_OTHER): Payer: BLUE CROSS/BLUE SHIELD | Admitting: Obstetrics & Gynecology

## 2020-04-02 VITALS — BP 120/80 | Ht 64.0 in | Wt 245.0 lb

## 2020-04-02 DIAGNOSIS — Z01419 Encounter for gynecological examination (general) (routine) without abnormal findings: Secondary | ICD-10-CM

## 2020-04-02 DIAGNOSIS — Z1272 Encounter for screening for malignant neoplasm of vagina: Secondary | ICD-10-CM

## 2020-04-02 MED ORDER — CLOTRIMAZOLE-BETAMETHASONE 1-0.05 % EX CREA: 1.0000 "application " | TOPICAL_CREAM | Freq: Two times a day (BID) | CUTANEOUS | 0 refills | Status: DC

## 2020-04-02 NOTE — Progress Notes (Signed)
HPI:      [name] is a 69 y.o. G5P0020 who LMP was in the past, she presents today for her annual examination.  The patient has no complaints today. The patient is sexually active. Herlast pap: approximate date (prior to hysterectomy 2007) and was normal and last mammogram: approximate date 10/2019 and was normal.  The patient does perform self breast exams.  There is notable family history of breast or ovarian cancer in her family. The patient is not taking hormone replacement therapy. Patient reports post-menopausal vaginal bleeding. Patient feels bleeding is coming from vagina, but was feeling irritated and raw at the time, this happneed only one time.   The patient has regular exercise: yes. The patient denies current symptoms of depression.  Occas LOU w full bladder.  GYN Hx: Last Colonoscopy:1 year ago. Normal.  Last DEXA: never ago.    PMHx: Past Medical History:  Diagnosis Date  . Arthritis   . Asthma    wheezing with URI- no inhalers day to day  . Cervical disc herniation   . Colon adenomas   . Deep vein thrombosis (DVT) during current hospitalization ([place])   . Easy bruising   . GERD (gastroesophageal reflux disease)   . Hypertension   . Mitral valve prolapse    eccho 7/13 chart  . Peripheral vascular disease ([place]) 7/12   DVT  left leg  post op- coumadin x 6 months  . Sleep apnea    Past Surgical History:  Procedure Laterality Date  . ABDOMINAL HYSTERECTOMY  2007   total  . CERVICAL SPINE SURGERY    . CHOLECYSTECTOMY    . COLON SURGERY  06/30/10   colectomy, Dr Pat Patrick  . COLONOSCOPY WITH PROPOFOL N/A 02/13/2018   Procedure: COLONOSCOPY WITH PROPOFOL;  Surgeon: Lollie Sails, MD;  Location: [facility] ENDOSCOPY;  Service: Endoscopy;  Laterality: N/A;  . COLOSTOMY  2011   Dr Pat Patrick  . COLOSTOMY TAKEDOWN  02/2011   Dr Pat Patrick  . FLEXIBLE SIGMOIDOSCOPY N/A 06/05/2018   Procedure: FLEXIBLE SIGMOIDOSCOPY;  Surgeon: Lollie Sails, MD;  Location: [facility] ENDOSCOPY;   Service: Endoscopy;  Laterality: N/A;  . HERNIA REPAIR    . INSERTION OF MESH N/A 02/26/2013   Procedure: INSERTION OF MESH;  Surgeon: Imogene Burn. Georgette Dover, MD;  Location: [facility] ORS;  Service: General;  Laterality: N/A;  . LYSIS OF ADHESION N/A 02/26/2013   Procedure: LYSIS OF ADHESION;  Surgeon: Imogene Burn. Georgette Dover, MD;  Location: [facility] ORS;  Service: General;  Laterality: N/A;  . SPINE SURGERY  2010   cervical neck fusion/  LIMITED MOBILITY  . TONSILLECTOMY    . TRANSANAL EXCISION OF RECTAL MASS N/A 11/10/2018   Procedure: TRANSANAL EXCISION OF RECTAL POLYP;  Surgeon: Layney Gillson Bellow, MD;  Location: [facility] ORS;  Service: General;  Laterality: N/A;  . VENTRAL HERNIA REPAIR N/A 02/26/2013   Procedure: HERNIA REPAIR VENTRAL ADULT;  Surgeon: Imogene Burn. Georgette Dover, MD;  Location: [facility] ORS;  Service: General;  Laterality: N/A;   Family History  Problem Relation Age of Onset  . Arthritis Mother   . Hypertension Mother   . Breast cancer Paternal Aunt 70  . Colon cancer Maternal Grandfather 86  . Colon cancer Cousin        maternal   Social History   Tobacco Use  . Smoking status: Former Smoker    Packs/day: 1.00    Years: 10.00    Pack years: 10.00    Types: Cigarettes  Quit date: 01/16/1985    Years since quitting: 35.2  . Smokeless tobacco: Never Used  Vaping Use  . Vaping Use: Never used  Substance Use Topics  . Alcohol use: Yes    Comment: occ  . Drug use: No    Current Outpatient Medications:  .  acetaminophen (TYLENOL) 650 MG CR tablet, Take 1,300 mg by mouth every 8 (eight) hours as needed for pain., Disp: , Rfl:  .  bisoprolol-hydrochlorothiazide (ZIAC) 2.5-6.25 MG per tablet, Take 1 tablet by mouth every morning. , Disp: , Rfl:  .  Calcium Carb-Cholecalciferol (CALCIUM 600/VITAMIN D3 PO), Take 1 tablet by mouth daily., Disp: , Rfl:  .  DULoxetine (CYMBALTA) 60 MG capsule, Take 60 mg by mouth daily. , Disp: , Rfl:  .  esomeprazole (NEXIUM) 40 MG capsule, Take 40 mg by mouth daily., Disp: ,  Rfl:  .  furosemide (LASIX) 20 MG tablet, Take 40 mg by mouth daily. , Disp: , Rfl:  .  hydrocortisone (ANUSOL-HC) 2.5 % rectal cream, Place 1 application rectally 2 (two) times daily., Disp: 30 g, Rfl: 0 .  levocetirizine (XYZAL) 5 MG tablet, Take by mouth., Disp: , Rfl:  .  metaxalone (SKELAXIN) 800 MG tablet, Take 800 mg by mouth 2 (two) times daily. , Disp: , Rfl:  .  OVER THE COUNTER MEDICATION, Apply 1 application topically every 6 (six) hours as needed (for pain). Toprisin Topical Cream, Disp: , Rfl:  .  OVER THE COUNTER MEDICATION, Take 2 capsules by mouth 3 (three) times daily. Turmeric Rose [employer] Supplement, Disp: , Rfl:  .  OVER THE COUNTER MEDICATION, 1-2 tablets daily. ARTHROZENE, Disp: , Rfl:  .  potassium chloride (K-DUR) 10 MEQ tablet, Take 10 mEq by mouth daily. , Disp: , Rfl:  .  potassium chloride (KLOR-CON) 10 MEQ tablet, Take by mouth., Disp: , Rfl:  .  telmisartan (MICARDIS) 40 MG tablet, Take 40 mg by mouth daily., Disp: , Rfl:  .  BOSWELLIA SERRATA PO, Take by mouth. (Patient not taking: Reported on 04/02/2020), Disp: , Rfl:  .  clotrimazole-betamethasone (LOTRISONE) cream, Apply 1 application topically 2 (two) times daily., Disp: 30 g, Rfl: 0 Allergies: Morphine and related, Novocain [procaine], Aspirin, Doxycycline hyclate, Levaquin [levofloxacin in d5w], Penicillins, Sulfur, and Tape  Review of Systems  Constitutional: Negative for chills, fever and malaise/fatigue.  HENT: Negative for congestion, sinus pain and sore throat.   Eyes: Negative for blurred vision and pain.  Respiratory: Negative for cough and wheezing.   Cardiovascular: Negative for chest pain and leg swelling.  Gastrointestinal: Negative for abdominal pain, constipation, diarrhea, heartburn, nausea and vomiting.  Genitourinary: Positive for frequency. Negative for dysuria, hematuria and urgency.  Musculoskeletal: Negative for back pain, joint pain, myalgias and neck pain.  Skin:  Negative for itching and rash.  Neurological: Negative for dizziness, tremors and weakness.  Endo/Heme/Allergies: Does not bruise/bleed easily.  Psychiatric/Behavioral: Negative for depression. The patient is not nervous/anxious and does not have insomnia.     Objective: BP 120/80   Ht 5\' 4"  (1.626 m)   Wt (!) 245 lb (111.1 kg)   BMI 42.05 kg/m   Filed Weights   04/02/20 0821  Weight: (!) 245 lb (111.1 kg)   Body mass index is 42.05 kg/m. Physical Exam Constitutional:      General: She is not in acute distress.    Appearance: She is well-developed.  Genitourinary:     Pelvic exam was performed with patient supine.  Vulva, urethra, bladder, vagina and rectum normal.     No lesions in the vagina.     No vaginal bleeding.     Cervix is absent.     Uterus is absent.     No right or left adnexal mass present.     Right adnexa not tender.     Left adnexa not tender.     Genitourinary Comments: Vaginal cuff well healed No bleeding  HENT:     Head: Normocephalic and atraumatic. No laceration.     Right Ear: Hearing normal.     Left Ear: Hearing normal.     Mouth/Throat:     Pharynx: Uvula midline.  Eyes:     Pupils: Pupils are equal, round, and reactive to light.  Neck:     Thyroid: No thyromegaly.  Cardiovascular:     Rate and Rhythm: Normal rate and regular rhythm.     Heart sounds: No murmur heard.  No friction rub. No gallop.   Pulmonary:     Effort: Pulmonary effort is normal. No respiratory distress.     Breath sounds: Normal breath sounds. No wheezing.  Chest:     Breasts:        Right: No mass, skin change or tenderness.        Left: No mass, skin change or tenderness.  Abdominal:     General: Bowel sounds are normal. There is no distension.     Palpations: Abdomen is soft.     Tenderness: There is no abdominal tenderness. There is no rebound.  Musculoskeletal:        General: Normal range of motion.     Cervical back: Normal range of motion and neck  supple.  Neurological:     Mental Status: She is alert and oriented to person, place, and time.     Cranial Nerves: No cranial nerve deficit.  Skin:    General: Skin is warm and dry.  Psychiatric:        Judgment: Judgment normal.  Vitals reviewed.     Assessment: Annual Exam 1. Women's annual routine gynecological examination   2. Screening for vaginal cancer     Plan:            1.  Vaginal Screening-  Pap smear done today  2. Breast screening- Exam annually and mammogram scheduled  3. Colonoscopy every 10 years, Hemoccult testing after age 36  4. Labs managed by PCP  5. Counseling for hormonal therapy: none              6. FRAX - FRAX score for assessing the 10 year probability for fracture calculated and discussed today.  Based on age and score today, DEXA is not currently scheduled.   7. Lotrisone for buttocks irritated lesion   8. No PMB to evaluate today.  Monitor for more bleeding, could be related to vaginal atrophy in the future.    F/U  Return in about 1 year (around 04/02/2021) for Annual.  Barnett Applebaum, MD, [facility], [facility] 04/02/2020  8:49 AM

## 2020-04-02 NOTE — Patient Instructions (Signed)
PAP every 5 years Mammogram every year Colonoscopy every 10 years Labs yearly (with PCP)  Thank you for choosing [facility]. As part of our ongoing efforts to improve patient experience, we would appreciate your feedback. Please fill out the short survey that you will receive by mail or MyChart. Your opinion is important to [place]!  Betamethasone; Clotrimazole skin cream What is this medicine? BETAMETHASONE; CLOTRIMAZOLE (bay ta METH a sone; kloe TRIM a zole) is a corticosteroid and antifungal cream. It treats ringworm and infections like jock itch and athlete's foot. It also helps reduce swelling, redness, and itching caused by these infections. This medicine may be used for other purposes; ask your health care provider or pharmacist if you have questions. COMMON BRAND NAME(S): Lotrisone What should I tell my health care provider before I take this medicine? They need to know if you have any of these conditions:  large areas of burned or damaged skin  skin thinning  peripheral vascular disease or poor circulation  an unusual or allergic reaction to betamethasone, clotrimazole, other corticosteroids, other antifungals, other medicines, foods, dyes, or preservatives  pregnant or trying to get pregnant  breast-feeding How should I use this medicine? This cream is for external use only. Do not take by mouth. Follow the directions on the prescription label. Wash your hands before and after use. If treating hand or nail infections, wash hands before use only. Apply a thin layer of cream to the affected area and rub in gently. Do not cover or wrap the treated area with an airtight bandage (like a plastic bandage). Use the cream for the full course of treatment prescribed, even if you think the condition is getting better. Use the medicine at regular intervals. Do not use more often than directed. Do not use on healthy skin or over large areas of skin. Do not use this medicine for any condition  other than the one for which it was prescribed. When applying to the groin area, apply a small amount and do not use for longer than 2 weeks unless directed to by your doctor or health care professional. Do not get this cream in your eyes. If you do, rinse out with plenty of cool tap water. Talk to your pediatrician regarding the use of this medicine in children. While this drug may be prescribed for children as young as 17 years for selected conditions, precautions do apply. Patients over 45 years old may have a stronger reaction and need a smaller dose. Overdosage: If you think you have taken too much of this medicine contact a poison control center or emergency room at once. NOTE: This medicine is only for you. Do not share this medicine with others. What if I miss a dose? If you miss a dose, use it as soon as you can. If it is almost time for your next dose, use only that dose. Do not use double or take extra doses. What may interact with this medicine?  topical products that have nystatin This list may not describe all possible interactions. Give your health care provider a list of all the medicines, herbs, non-prescription drugs, or dietary supplements you use. Also tell them if you smoke, drink alcohol, or use illegal drugs. Some items may interact with your medicine. What should I watch for while using this medicine? If using this medicine on your body or groin tell your doctor or health care professional if your symptoms do not improve within 1 week. If using this medicine on  your feet tell your doctor or health care professional if your symptoms do not improve within 2 weeks. Tell your doctor if your skin infection returns after you stop using this cream. If you are using this cream for 'jock itch' be sure to dry the groin completely after bathing. Do not wear underwear that is tight-fitting or made from synthetic fibers like rayon or nylon. Wear loose-fitting, cotton underwear. If you are  using this cream for athlete's foot be sure to dry your feet carefully after bathing, especially between the toes. Do not wear socks made from wool or synthetic materials like rayon or nylon. Wear clean cotton socks and change them at least once a day, change them more if your feet sweat a lot. Also, try to wear sandals or shoes that are well-ventilated. Do not use this cream to treat diaper rash. What side effects may I notice from receiving this medicine? Side effects that you should report to your doctor or health care professional as soon as possible:  allergic reactions like skin rash, itching or hives, swelling of the face, lips, or tongue  dark red spots on the skin  lack of healing of skin condition  loss of feeling on skin  painful, red, pus-filled blisters in hair follicles  skin infection  sores or blisters that do not heal properly  thinning of the skin or sunburn Side effects that usually do not require medical attention (report to your doctor or health care professional if they continue or are bothersome):  dry or peeling skin  minor skin irritation, burning, or itching This list may not describe all possible side effects. Call your doctor for medical advice about side effects. You may report side effects to FDA at 1-800-FDA-1088. Where should I keep my medicine? Keep out of the reach of children. Store at room temperature between 15 and 30 degrees C ( 59 and 86 degrees F). Do not freeze. Throw away any unused medicine after the expiration date. NOTE: This sheet is a summary. It may not cover all possible information. If you have questions about this medicine, talk to your doctor, pharmacist, or health care provider.  2020 Elsevier/Gold Standard (2007-11-22 16:14:28)

## 2020-04-03 LAB — CYTOLOGY - PAP
Comment: NEGATIVE
Diagnosis: NEGATIVE
High risk HPV: POSITIVE — AB

## 2020-04-03 NOTE — Progress Notes (Signed)
PAP w HPV (vagina only).   Plan repeat one ywar.  Barnett Applebaum, MD, [facility], [facility] 04/03/2020  3:11 PM

## 2020-05-02 ENCOUNTER — Ambulatory Visit: Payer: BC Managed Care – PPO | Attending: Internal Medicine

## 2020-05-02 DIAGNOSIS — Z23 Encounter for immunization: Secondary | ICD-10-CM

## 2020-05-02 NOTE — Progress Notes (Signed)
   Covid-19 Vaccination Clinic  Name:  TARHONDA HOLLENBERG    MRN: [id number] DOB: November 25, 1950  05/02/2020  Ms. Kluth was observed post Covid-19 immunization for 15 minutes without incident. She was provided with Vaccine Information Sheet and instruction to access the V-Safe system.   Ms. Edsall was instructed to call 911 with any severe reactions post vaccine: Marland Kitchen Difficulty breathing  . Swelling of face and throat  . A fast heartbeat  . A bad rash all over body  . Dizziness and weakness

## 2020-09-29 ENCOUNTER — Other Ambulatory Visit: Payer: BC Managed Care – PPO

## 2020-09-29 DIAGNOSIS — Z20822 Contact with and (suspected) exposure to covid-19: Secondary | ICD-10-CM

## 2020-09-30 LAB — NOVEL CORONAVIRUS, NAA: SARS-CoV-2, NAA: NOT DETECTED

## 2020-09-30 LAB — SARS-COV-2, NAA 2 DAY TAT

## 2020-12-31 ENCOUNTER — Other Ambulatory Visit: Payer: Self-pay | Admitting: Internal Medicine

## 2020-12-31 DIAGNOSIS — Z1231 Encounter for screening mammogram for malignant neoplasm of breast: Secondary | ICD-10-CM

## 2021-01-01 ENCOUNTER — Ambulatory Visit
Admission: RE | Admit: 2021-01-01 | Discharge: 2021-01-01 | Disposition: A | Discharge status: Home / Self Care | Payer: BC Managed Care – PPO | Source: Ambulatory Visit | Attending: Internal Medicine | Admitting: Internal Medicine

## 2021-01-01 ENCOUNTER — Other Ambulatory Visit: Payer: Self-pay

## 2021-01-01 DIAGNOSIS — Z1231 Encounter for screening mammogram for malignant neoplasm of breast: Secondary | ICD-10-CM | POA: Diagnosis present

## 2021-02-21 ENCOUNTER — Encounter: Payer: Self-pay | Admitting: Emergency Medicine

## 2021-02-21 ENCOUNTER — Emergency Department: Payer: Medicare Other

## 2021-02-21 ENCOUNTER — Other Ambulatory Visit: Payer: Self-pay

## 2021-02-21 ENCOUNTER — Emergency Department
Admission: EM | Admit: 2021-02-21 | Discharge: 2021-02-21 | Disposition: A | Discharge status: Home / Self Care | Payer: Medicare Other | Attending: Emergency Medicine | Admitting: Emergency Medicine

## 2021-02-21 DIAGNOSIS — W228XXA Striking against or struck by other objects, initial encounter: Secondary | ICD-10-CM | POA: Insufficient documentation

## 2021-02-21 DIAGNOSIS — J45909 Unspecified asthma, uncomplicated: Secondary | ICD-10-CM | POA: Insufficient documentation

## 2021-02-21 DIAGNOSIS — S0990XA Unspecified injury of head, initial encounter: Secondary | ICD-10-CM | POA: Diagnosis present

## 2021-02-21 DIAGNOSIS — Z87891 Personal history of nicotine dependence: Secondary | ICD-10-CM | POA: Diagnosis not present

## 2021-02-21 DIAGNOSIS — Z79899 Other long term (current) drug therapy: Secondary | ICD-10-CM | POA: Insufficient documentation

## 2021-02-21 DIAGNOSIS — I10 Essential (primary) hypertension: Secondary | ICD-10-CM | POA: Diagnosis not present

## 2021-02-21 NOTE — ED Triage Notes (Signed)
Pt reports that she was trying to reach a blender and took her broomstick to inch it off the shelf. It slipped and hit her in the head. Did not knock her. She does not take blood thinners. Very small hematoma. Pt reports that she is only here because her daughters made her.

## 2021-02-21 NOTE — ED Provider Notes (Signed)
[facility] Emergency Department Provider Note  ____________________________________________   Event Date/Time   First MD Initiated Contact with Patient 02/21/21 1704     (approximate)  I have reviewed the triage vital signs and the nursing notes.   HISTORY  Chief Complaint Head Injury   HPI Kelly Perry is a 70 y.o. female with a past medical history of arthritis, asthma, DVT, HTN and GERD not on any anticoagulation or blood thinners who presents accompanied by her daughter after she struck her head lifting a blender from a high shelf shortly prior to arrival.  She denies any LOC.  She not have any other injuries and she is otherwise been in her usual state health without any recent fevers, vomiting, cough, shortness of breath, diarrhea, dysuria, rash or any other recent traumatic injuries or falls.  She states her daughter wanted her to get checked out.  She denies any vision changes weakness or any other associated sick symptoms today.         Past Medical History:  Diagnosis Date   Arthritis    Asthma    wheezing with URI- no inhalers day to day   Cervical disc herniation    Colon adenomas    Deep vein thrombosis (DVT) during current hospitalization ([place])    Easy bruising    GERD (gastroesophageal reflux disease)    Hypertension    Mitral valve prolapse    eccho 7/13 chart   Peripheral vascular disease ([place]) 7/12   DVT  left leg  post op- coumadin x 6 months   Sleep apnea     Patient Active Problem List   Diagnosis Date Noted   Rectal polyp 06/09/2018   Mitral valve prolapse 10/31/2017   Morbid obesity with BMI of 40.0-44.9, adult ([place]) 08/03/2017   Tubular adenoma 08/16/2016   Essential hypertension 07/16/2015   GERD without esophagitis 07/16/2015   OSA on CPAP 07/16/2015   DDD (degenerative disc disease), lumbar 02/13/2014   Lumbar radiculitis 02/13/2014    Past Surgical History:  Procedure Laterality Date   ABDOMINAL  HYSTERECTOMY  2007   total   CERVICAL SPINE SURGERY     CHOLECYSTECTOMY     COLON SURGERY  06/30/10   colectomy, Dr Pat Patrick   COLONOSCOPY WITH PROPOFOL N/A 02/13/2018   Procedure: COLONOSCOPY WITH PROPOFOL;  Surgeon: Lollie Sails, MD;  Location: [facility] ENDOSCOPY;  Service: Endoscopy;  Laterality: N/A;   COLOSTOMY  2011   Dr Pat Patrick   COLOSTOMY TAKEDOWN  02/2011   Dr Haskell Riling SIGMOIDOSCOPY N/A 06/05/2018   Procedure: FLEXIBLE SIGMOIDOSCOPY;  Surgeon: Lollie Sails, MD;  Location: [facility] ENDOSCOPY;  Service: Endoscopy;  Laterality: N/A;   HERNIA REPAIR     INSERTION OF MESH N/A 02/26/2013   Procedure: INSERTION OF MESH;  Surgeon: Imogene Burn. Georgette Dover, MD;  Location: [facility] ORS;  Service: General;  Laterality: N/A;   LYSIS OF ADHESION N/A 02/26/2013   Procedure: LYSIS OF ADHESION;  Surgeon: Imogene Burn. Georgette Dover, MD;  Location: [facility] ORS;  Service: General;  Laterality: N/A;   SPINE SURGERY  2010   cervical neck fusion/  LIMITED MOBILITY   TONSILLECTOMY     TRANSANAL EXCISION OF RECTAL MASS N/A 11/10/2018   Procedure: TRANSANAL EXCISION OF RECTAL POLYP;  Surgeon: Robert Bellow, MD;  Location: [facility] ORS;  Service: General;  Laterality: N/A;   VENTRAL HERNIA REPAIR N/A 02/26/2013   Procedure: HERNIA REPAIR VENTRAL ADULT;  Surgeon: Imogene Burn. Georgette Dover, MD;  Location:  [facility] ORS;  Service: General;  Laterality: N/A;    Prior to Admission medications   Medication Sig Start Date End Date Taking? Authorizing Provider  acetaminophen (TYLENOL) 650 MG CR tablet Take 1,300 mg by mouth every 8 (eight) hours as needed for pain.    [provider]  bisoprolol-hydrochlorothiazide [facility]) 2.5-6.25 MG per tablet Take 1 tablet by mouth every morning.  11/24/12   [provider]  BOSWELLIA SERRATA PO Take by mouth. Patient not taking: Reported on 04/02/2020    [provider]  Calcium Carb-Cholecalciferol (CALCIUM 600/VITAMIN D3 PO) Take 1 tablet by mouth daily.    [provider]   clotrimazole-betamethasone (LOTRISONE) cream Apply 1 application topically 2 (two) times daily. 04/02/20   Gae Dry, MD  DULoxetine (CYMBALTA) 60 MG capsule Take 60 mg by mouth daily.  01/14/13   [provider]  esomeprazole (NEXIUM) 40 MG capsule Take 40 mg by mouth daily.    [provider]  furosemide (LASIX) 20 MG tablet Take 40 mg by mouth daily.     [provider]  hydrocortisone (ANUSOL-HC) 2.5 % rectal cream Place 1 application rectally 2 (two) times daily. 11/23/18   Robert Bellow, MD  levocetirizine (XYZAL) 5 MG tablet Take by mouth.    [provider]  metaxalone (SKELAXIN) 800 MG tablet Take 800 mg by mouth 2 (two) times daily.  01/15/13   [provider]  OVER THE COUNTER MEDICATION Apply 1 application topically every 6 (six) hours as needed (for pain). Toprisin Topical Cream    [provider]  OVER THE COUNTER MEDICATION Take 2 capsules by mouth 3 (three) times daily. Turmeric Rose Mary Leaf Dogwood Bark Supplement    [provider]  OVER THE COUNTER MEDICATION 1-2 tablets daily. [place]    [provider]  potassium chloride (K-DUR) 10 MEQ tablet Take 10 mEq by mouth daily.     [provider]  potassium chloride (KLOR-CON) 10 MEQ tablet Take by mouth. 12/11/19   [provider]  telmisartan (MICARDIS) 40 MG tablet Take 40 mg by mouth daily.    [provider]    Allergies Morphine and related, Novocain [procaine], Aspirin, Doxycycline hyclate, Elemental sulfur, Levaquin [levofloxacin in d5w], Penicillins, and Tape  Family History  Problem Relation Age of Onset   Arthritis Mother    Hypertension Mother    Breast cancer Paternal Aunt 16   Colon cancer Maternal Grandfather 62   Colon cancer Cousin        maternal    Social History Social History   Tobacco Use   Smoking status: Former    Packs/day: 1.00    Years: 10.00    Pack years: 10.00    Types:  Cigarettes    Quit date: 01/16/1985    Years since quitting: 36.1   Smokeless tobacco: Never  Vaping Use   Vaping Use: Never used  Substance Use Topics   Alcohol use: Yes    Comment: occ   Drug use: No    Review of Systems  Review of Systems  Constitutional:  Negative for chills and fever.  HENT:  Negative for sore throat.   Eyes:  Negative for pain.  Respiratory:  Negative for cough and stridor.   Cardiovascular:  Negative for chest pain.  Gastrointestinal:  Negative for vomiting.  Genitourinary:  Negative for dysuria.  Musculoskeletal:  Negative for myalgias.  Skin:  Negative for rash.  Neurological:  Positive for headaches. Negative for seizures  and loss of consciousness.  Psychiatric/Behavioral:  Negative for suicidal ideas.   All other systems reviewed and are negative.    ____________________________________________   PHYSICAL EXAM:  VITAL SIGNS: ED Triage Vitals  Enc Vitals Group     BP 02/21/21 1632 127/67     Pulse Rate 02/21/21 1632 78     Resp 02/21/21 1632 18     Temp 02/21/21 1632 98.7 F (37.1 C)     Temp Source 02/21/21 1632 Oral     SpO2 02/21/21 1632 97 %     Weight 02/21/21 1634 244 lb 14.9 oz (111.1 kg)     Height 02/21/21 1634 5\' 4"  (1.626 m)     Head Circumference --      Peak Flow --      Pain Score 02/21/21 1633 3     Pain Loc --      Pain Edu? --      Excl. in [place]? --    Vitals:   02/21/21 1632 02/21/21 1924  BP: 127/67 124/72  Pulse: 78 87  Resp: 18 18  Temp: 98.7 F (37.1 C) 98.3 F (36.8 C)  SpO2: 97% 99%   Physical Exam Vitals and nursing note reviewed.  Constitutional:      General: She is not in acute distress.    Appearance: She is well-developed.  HENT:     Head: Normocephalic and atraumatic.     Right Ear: External ear normal.     Left Ear: External ear normal.     Nose: Nose normal.  Eyes:     Conjunctiva/sclera: Conjunctivae normal.  Cardiovascular:     Rate and Rhythm: Normal rate and regular rhythm.      Heart sounds: No murmur heard. Pulmonary:     Effort: Pulmonary effort is normal. No respiratory distress.     Breath sounds: Normal breath sounds.  Abdominal:     Palpations: Abdomen is soft.     Tenderness: There is no abdominal tenderness.  Musculoskeletal:     Cervical back: Neck supple.  Skin:    General: Skin is warm and dry.     Capillary Refill: Capillary refill takes less than 2 seconds.  Neurological:     Mental Status: She is alert and oriented to person, place, and time.    Cranial nerves II through XII grossly intact.  No pronator drift.  No finger dysmetria.  Symmetric 5/5 strength of all extremities.  Sensation intact to light touch in all extremities.  Unremarkable unassisted gait.  There is no tenderness step-offs deformities over the C/C/L-spine.  2+ radial pulses.  No obvious trauma to the extremities face oropharynx and neck or scalp aside from some very mild tenderness over the dome. ____________________________________________   LABS (all labs ordered are listed, but only abnormal results are displayed)  Labs Reviewed - No data to display ____________________________________________  EKG   ____________________________________________  RADIOLOGY  ED MD interpretation: No evidence of intracranial hemorrhage, skull fracture or other clear acute process on CT head.  Official radiology report(s): CT Head Wo Contrast  Result Date: 02/21/2021 CLINICAL DATA:  Head trauma EXAM: CT HEAD WITHOUT CONTRAST TECHNIQUE: Contiguous axial images were obtained from the base of the skull through the vertex without intravenous contrast. COMPARISON:  None. FINDINGS: Brain: No evidence of acute infarction, hemorrhage, hydrocephalus, extra-axial collection or mass lesion/mass effect. Vascular: Atherosclerotic calcifications involving the large vessels of the skull base. No unexpected hyperdense vessel. Skull: Normal. Negative for fracture or focal lesion. Sinuses/Orbits: No  acute  finding. Other: No significant scalp hematoma. IMPRESSION: No acute intracranial findings. Electronically Signed   By: Davina Poke D.O.   On: 02/21/2021 18:31    ____________________________________________   PROCEDURES  Procedure(s) performed (including Critical Care):  Procedures   ____________________________________________   INITIAL IMPRESSION / ASSESSMENT AND PLAN / ED COURSE      Patient presents with above to history exam for her is obtainable under 1 to her head.  She not have any LOC and endorses a headache but no other significant associated symptoms.  She is not anticoagulated.  She is afebrile and hemodynamically stable.  She has mild tenderness over the temporal scalp but no other evidence of trauma on exam and she has a nonfocal neuro exam.  CT head shows no evidence of skull fracture intracranial hemorrhage.  Suspect contusion with possible concussion.  Low suspicion for significant occult injury.  Discharged stable condition.  Strict return precautions advised and discussed.  Recommended close outpatient PCP follow-up for any persistent concussive symptoms.        ____________________________________________   FINAL CLINICAL IMPRESSION(S) / ED DIAGNOSES  Final diagnoses:  Injury of head, initial encounter    Medications - No data to display   ED Discharge Orders     None        Note:  This document was prepared using Dragon voice recognition software and may include unintentional dictation errors.    Lucrezia Starch, MD 02/21/21 [id number]

## 2021-06-23 ENCOUNTER — Encounter: Payer: Self-pay | Admitting: General Surgery

## 2022-01-05 ENCOUNTER — Other Ambulatory Visit: Payer: Self-pay | Admitting: Gastroenterology

## 2022-01-05 DIAGNOSIS — R748 Abnormal levels of other serum enzymes: Secondary | ICD-10-CM

## 2022-01-05 DIAGNOSIS — Z86010 Personal history of colonic polyps: Secondary | ICD-10-CM

## 2022-04-28 ENCOUNTER — Ambulatory Visit: Payer: Medicare Other | Admitting: Anesthesiology

## 2022-04-28 ENCOUNTER — Ambulatory Visit
Admission: RE | Admit: 2022-04-28 | Discharge: 2022-04-28 | Disposition: A | Discharge status: Home / Self Care | Payer: Medicare Other | Attending: Internal Medicine | Admitting: Internal Medicine

## 2022-04-28 ENCOUNTER — Encounter
Admission: RE | Disposition: A | Discharge status: Home / Self Care | Payer: Self-pay | Source: Home / Self Care | Attending: Internal Medicine

## 2022-04-28 DIAGNOSIS — K640 First degree hemorrhoids: Secondary | ICD-10-CM | POA: Diagnosis not present

## 2022-04-28 DIAGNOSIS — K5730 Diverticulosis of large intestine without perforation or abscess without bleeding: Secondary | ICD-10-CM | POA: Diagnosis not present

## 2022-04-28 DIAGNOSIS — D128 Benign neoplasm of rectum: Secondary | ICD-10-CM | POA: Diagnosis not present

## 2022-04-28 DIAGNOSIS — Z86010 Personal history of colonic polyps: Secondary | ICD-10-CM | POA: Diagnosis not present

## 2022-04-28 DIAGNOSIS — Z1211 Encounter for screening for malignant neoplasm of colon: Secondary | ICD-10-CM | POA: Insufficient documentation

## 2022-04-28 DIAGNOSIS — Z980 Intestinal bypass and anastomosis status: Secondary | ICD-10-CM | POA: Insufficient documentation

## 2022-04-28 HISTORY — PX: COLONOSCOPY: SHX5424

## 2022-04-28 SURGERY — COLONOSCOPY
Anesthesia: General

## 2022-04-28 MED ORDER — PROPOFOL 500 MG/50ML IV EMUL
INTRAVENOUS | Status: DC | PRN
  Administered 2022-04-28: 200 ug/kg/min via INTRAVENOUS

## 2022-04-28 MED ORDER — PROPOFOL 10 MG/ML IV BOLUS
INTRAVENOUS | Status: DC | PRN
  Administered 2022-04-28: 80 mg via INTRAVENOUS

## 2022-04-28 MED ORDER — SODIUM CHLORIDE 0.9 % IV SOLN: INTRAVENOUS | Status: DC

## 2022-04-28 NOTE — H&P (Signed)
Outpatient short stay form Pre-procedure 04/28/2022 12:17 PM Kelly Perry K. Kelly Perry, M.D.  Primary Physician: Kelly Perry, M.D.  Reason for visit:  Personal history of sessile serrated adenoma and traditional adenomatous colon polyps - 2019  History of present illness:                            Patient presents for colonoscopy for a personal hx of colon polyps. The patient denies abdominal pain, abnormal weight loss or rectal bleeding.      Current Facility-Administered Medications:    0.9 %  sodium chloride infusion, , Intravenous, Continuous, Kelly Perry, Kelly Pike, MD  Medications Prior to Admission  Medication Sig Dispense Refill Last Dose   bisoprolol-hydrochlorothiazide (ZIAC) 2.5-6.25 MG per tablet Take 1 tablet by mouth every morning.    04/27/2022   DULoxetine (CYMBALTA) 60 MG capsule Take 60 mg by mouth daily.    04/28/2022   esomeprazole (NEXIUM) 40 MG capsule Take 40 mg by mouth daily.   04/28/2022   telmisartan (MICARDIS) 40 MG tablet Take 40 mg by mouth daily.   04/28/2022   acetaminophen (TYLENOL) 650 MG CR tablet Take 1,300 mg by mouth every 8 (eight) hours as needed for pain.      BOSWELLIA SERRATA PO Take by mouth. (Patient not taking: Reported on 04/02/2020)      Calcium Carb-Cholecalciferol (CALCIUM 600/VITAMIN D3 PO) Take 1 tablet by mouth daily.      clotrimazole-betamethasone (LOTRISONE) cream Apply 1 application topically 2 (two) times daily. 30 g 0    furosemide (LASIX) 20 MG tablet Take 40 mg by mouth daily.       hydrocortisone (ANUSOL-HC) 2.5 % rectal cream Place 1 application rectally 2 (two) times daily. 30 g 0    levocetirizine (XYZAL) 5 MG tablet Take by mouth.      metaxalone (SKELAXIN) 800 MG tablet Take 800 mg by mouth 2 (two) times daily.       OVER THE COUNTER MEDICATION Apply 1 application topically every 6 (six) hours as needed (for pain). Toprisin Topical Cream      OVER THE COUNTER MEDICATION Take 2 capsules by mouth 3 (three) times daily. Turmeric Rose Mary  Leaf Dogwood Bark Supplement      OVER THE COUNTER MEDICATION 1-2 tablets daily. ARTHROZENE      potassium chloride (K-DUR) 10 MEQ tablet Take 10 mEq by mouth daily.       potassium chloride (KLOR-CON) 10 MEQ tablet Take by mouth.        Allergies  Allergen Reactions   Morphine And Related Other (See Comments)    Elephant on chest After IV push med delivered can tolerate slow delivery per pt 10/16/2018   Novocain [Procaine] Other (See Comments)    Blackout (as a child)   Aspirin Other (See Comments)    Ringing in the ears   Doxycycline Hyclate Diarrhea   Elemental Sulfur Other (See Comments)    sweating   Levaquin [Levofloxacin In D5w] Diarrhea   Penicillins Rash   Tape Other (See Comments)    USE PAPER TAPE     Past Medical History:  Diagnosis Date   Arthritis    Asthma    wheezing with URI- no inhalers day to day   Cervical disc herniation    Colon adenomas    Deep vein thrombosis (DVT) during current hospitalization ([place])    Easy bruising    GERD (gastroesophageal reflux disease)    Hypertension  Mitral valve prolapse    eccho 7/13 chart   Peripheral vascular disease ([place]) 7/12   DVT  left leg  post op- coumadin x 6 months   Sleep apnea     Review of systems:  Otherwise negative.    Physical Exam  Gen: Alert, oriented. Appears stated age.  HEENT: Long/AT. PERRLA. Lungs: CTA, no wheezes. CV: RR nl S1, S2. Abd: soft, benign, no masses. BS+ Ext: No edema. Pulses 2+    Planned procedures: Proceed with colonoscopy. The patient understands the nature of the planned procedure, indications, risks, alternatives and potential complications including but not limited to bleeding, infection, perforation, damage to internal organs and possible oversedation/side effects from anesthesia. The patient agrees and gives consent to proceed.  Please refer to procedure notes for findings, recommendations and patient disposition/instructions.     Kelly Perry K. Kelly Perry,  M.D. Gastroenterology 04/28/2022  12:17 PM

## 2022-04-28 NOTE — Anesthesia Postprocedure Evaluation (Signed)
Anesthesia Post Note  Patient: Kelly Perry  Procedure(s) Performed: COLONOSCOPY  Patient location during evaluation: Endoscopy Anesthesia Type: General Level of consciousness: awake and alert Pain management: pain level controlled Vital Signs Assessment: post-procedure vital signs reviewed and stable Respiratory status: spontaneous breathing, nonlabored ventilation, respiratory function stable and patient connected to nasal cannula oxygen Cardiovascular status: blood pressure returned to baseline and stable Postop Assessment: no apparent nausea or vomiting Anesthetic complications: no   No notable events documented.   Last Vitals:  Vitals:   04/28/22 1313 04/28/22 1323  BP: 103/73 124/73  Pulse:  73  Resp:    Temp: 36.9 C   SpO2:  99%    Last Pain:  Vitals:   04/28/22 1333  TempSrc:   PainSc: 0-No pain                 Arita Miss

## 2022-04-28 NOTE — Op Note (Signed)
[facility] Gastroenterology Patient Name: Kelly Perry Procedure Date: 04/28/2022 12:35 PM MRN: [id number] Account #: [id number] Date of Birth: [birth year] Admit Type: Outpatient Age: 71 Room: [facility] ENDO ROOM 2 Gender: Female Note Status: Finalized Instrument Name: Park Meo [id number] Procedure:             Colonoscopy Indications:           High risk colon cancer surveillance: Personal history                         of multiple (3 or more) adenomas, High risk colon                         cancer surveillance: Personal history of sessile                         serrated colon polyp (10 mm or greater in size) Providers:             Benay Pike. Sharri Loya MD, MD Medicines:             Propofol per Anesthesia Complications:         No immediate complications. Procedure:             Pre-Anesthesia Assessment:                        - The risks and benefits of the procedure and the                         sedation options and risks were discussed with the                         patient. All questions were answered and informed                         consent was obtained.                        - Patient identification and proposed procedure were                         verified prior to the procedure by the nurse. The                         procedure was verified in the procedure room.                        - ASA Grade Assessment: III - A patient with severe                         systemic disease.                        - After reviewing the risks and benefits, the patient                         was deemed in satisfactory condition to undergo the                         procedure.  After obtaining informed consent, the colonoscope was                         passed under direct vision. Throughout the procedure,                         the patient's blood pressure, pulse, and oxygen                         saturations were monitored  continuously. The                         Colonoscope was introduced through the anus and                         advanced to the the cecum, identified by appendiceal                         orifice and ileocecal valve. The colonoscopy was                         somewhat difficult due to bowel stenosis. Successful                         completion of the procedure was aided by withdrawing                         the scope and replacing with the pediatric                         colonoscope. The patient tolerated the procedure well.                         The quality of the bowel preparation was good. The                         ileocecal valve, appendiceal orifice, and rectum were                         photographed. Findings:      The perianal and digital rectal examinations were normal. Pertinent       negatives include normal sphincter tone and no palpable rectal lesions.      Internal hemorrhoids were found during retroflexion. The hemorrhoids       were Grade I (internal hemorrhoids that do not prolapse).      There was evidence of a prior end-to-end colo-colonic anastomosis in the       distal sigmoid colon. This was patent and was characterized by moderate       stenosis. The anastomosis was traversed. Biopsies were taken with a cold       forceps for histology.      Many small-mouthed diverticula were found in the entire colon.      A 5 mm polyp was found in the distal rectum. The polyp was sessile. The       polyp was removed with a jumbo cold forceps. Resection and retrieval       were complete.      A 10 mm post polypectomy scar was found in the distal  rectum. There was       no evidence of the previous polyp. This was biopsied with a cold forceps       for histology.      The exam was otherwise without abnormality. Impression:            - Internal hemorrhoids.                        - Patent end-to-end colo-colonic anastomosis,                         characterized by  moderate stenosis. Biopsied.                        - Diverticulosis in the entire examined colon.                        - One 5 mm polyp in the distal rectum, removed with a                         jumbo cold forceps. Resected and retrieved.                        - Post-polypectomy scar in the distal rectum. Biopsied.                        - The examination was otherwise normal. Recommendation:        - Patient has a contact number available for                         emergencies. The signs and symptoms of potential                         delayed complications were discussed with the patient.                         Return to normal activities tomorrow. Written                         discharge instructions were provided to the patient.                        - Resume previous diet.                        - Continue present medications.                        - Repeat colonoscopy is recommended for surveillance.                         The colonoscopy date will be determined after                         pathology results from today's exam become available                         for review.                        -  Return to GI office at the next available                         appointment.                        - Telephone GI office to schedule appointment.                        - The findings and recommendations were discussed with                         the patient. Procedure Code(s):     --- Professional ---                        [phone], Colonoscopy, flexible; with biopsy, single or                         multiple Diagnosis Code(s):     --- Professional ---                        K57.30, Diverticulosis of large intestine without                         perforation or abscess without bleeding                        Z86.010, Personal history of colonic polyps                        Z98.890, Other specified postprocedural states                        K62.1, Rectal polyp                         K64.0, First degree hemorrhoids                        Z98.0, Intestinal bypass and anastomosis status CPT copyright 2019 American Medical Association. All rights reserved. The codes documented in this report are preliminary and upon coder review may  be revised to meet current compliance requirements. Efrain Sella MD, MD 04/28/2022 1:15:29 PM This report has been signed electronically. Number of Addenda: 0 Note Initiated On: 04/28/2022 12:35 PM Scope Withdrawal Time: 0 hours 7 minutes 19 seconds  Total Procedure Duration: 0 hours 19 minutes 14 seconds  Estimated Blood Loss:  Estimated blood loss was minimal.      [facility]

## 2022-04-28 NOTE — Anesthesia Procedure Notes (Signed)
Date/Time: 04/28/2022 12:50 PM  Performed by: Nelda Marseille, CRNAPre-anesthesia Checklist: Patient identified, Emergency Drugs available, Suction available, Patient being monitored and Timeout performed Oxygen Delivery Method: Simple face mask

## 2022-04-28 NOTE — Transfer of Care (Signed)
Immediate Anesthesia Transfer of Care Note  Patient: Kelly Perry  Procedure(s) Performed: COLONOSCOPY  Patient Location: PACU  Anesthesia Type:General  Level of Consciousness: awake, alert  and oriented  Airway & Oxygen Therapy: Patient Spontanous Breathing and Patient connected to face mask oxygen  Post-op Assessment: Report given to RN and Post -op Vital signs reviewed and stable  Post vital signs: Reviewed and stable  Last Vitals:  Vitals Value Taken Time  BP 103/73 04/28/22 1313  Temp 36.9 C 04/28/22 1313  Pulse 74 04/28/22 1315  Resp 13 04/28/22 1315  SpO2 97 % 04/28/22 1315  Vitals shown include unvalidated device data.  Last Pain:  Vitals:   04/28/22 1313  TempSrc: Temporal  PainSc: 0-No pain         Complications: No notable events documented.

## 2022-04-28 NOTE — Interval H&P Note (Signed)
History and Physical Interval Note:  04/28/2022 12:18 PM  Kelly Perry  has presented today for surgery, with the diagnosis of History of colon polyps (Z86.010).  The various methods of treatment have been discussed with the patient and family. After consideration of risks, benefits and other options for treatment, the patient has consented to  Procedure(s): COLONOSCOPY (N/A) as a surgical intervention.  The patient's history has been reviewed, patient examined, no change in status, stable for surgery.  I have reviewed the patient's chart and labs.  Questions were answered to the patient's satisfaction.     [place], [place]

## 2022-04-28 NOTE — Anesthesia Preprocedure Evaluation (Signed)
Anesthesia Evaluation  Patient identified by MRN, date of birth, ID band Patient awake    Reviewed: Allergy & Precautions, NPO status , Patient's Chart, lab work & pertinent test results  History of Anesthesia Complications Negative for: history of anesthetic complications  Airway Mallampati: II  TM Distance: >3 FB Neck ROM: Full    Dental no notable dental hx. (+) Teeth Intact   Pulmonary asthma , sleep apnea and Continuous Positive Airway Pressure Ventilation , neg COPD, Patient abstained from smoking.Not current smoker,    Pulmonary exam normal breath sounds clear to auscultation       Cardiovascular Exercise Tolerance: Good METShypertension, + Peripheral Vascular Disease  (-) CAD and (-) Past MI negative cardio ROS  (-) dysrhythmias  Rhythm:Regular Rate:Normal - Systolic murmurs    Neuro/Psych Neck pain, s/p cervical surgery  Neuromuscular disease negative psych ROS   GI/Hepatic GERD  Medicated and Controlled,(+)     (-) substance abuse  ,   Endo/Other  neg diabetesMorbid obesity  Renal/GU negative Renal ROS     Musculoskeletal   Abdominal (+) + obese,   Peds  Hematology   Anesthesia Other Findings Past Medical History: No date: Arthritis No date: Asthma     Comment:  wheezing with URI- no inhalers day to day No date: Cervical disc herniation No date: Colon adenomas No date: Deep vein thrombosis (DVT) during current hospitalization  (HCC) No date: Easy bruising No date: GERD (gastroesophageal reflux disease) No date: Hypertension No date: Mitral valve prolapse     Comment:  eccho 7/13 chart 7/12: Peripheral vascular disease ([place])     Comment:  DVT  left leg  post op- coumadin x 6 months No date: Sleep apnea  Reproductive/Obstetrics                             Anesthesia Physical Anesthesia Plan  ASA: 3  Anesthesia Plan: General   Post-op Pain Management: Minimal or  no pain anticipated   Induction: Intravenous  PONV Risk Score and Plan: 3 and Propofol infusion, TIVA and Ondansetron  Airway Management Planned: Nasal Cannula  Additional Equipment: None  Intra-op Plan:   Post-operative Plan:   Informed Consent: I have reviewed the patients History and Physical, chart, labs and discussed the procedure including the risks, benefits and alternatives for the proposed anesthesia with the patient or authorized representative who has indicated his/her understanding and acceptance.     Dental advisory given  Plan Discussed with: CRNA and Surgeon  Anesthesia Plan Comments: (Discussed risks of anesthesia with patient, including possibility of difficulty with spontaneous ventilation under anesthesia necessitating airway intervention, PONV, and rare risks such as cardiac or respiratory or neurological events, and allergic reactions. Discussed the role of CRNA in patient's perioperative care. Patient understands. Patient informed about increased incidence of above perioperative risk due to high BMI. Patient understands. )        Anesthesia Quick Evaluation

## 2022-04-29 ENCOUNTER — Encounter: Payer: Self-pay | Admitting: Internal Medicine

## 2022-04-29 LAB — SURGICAL PATHOLOGY

## 2022-07-11 ENCOUNTER — Other Ambulatory Visit: Payer: Self-pay

## 2022-07-11 ENCOUNTER — Encounter: Payer: Self-pay | Admitting: Intensive Care

## 2022-07-11 ENCOUNTER — Inpatient Hospital Stay
Admission: EM | Admit: 2022-07-11 | Discharge: 2022-07-17 | DRG: 389 | Disposition: A | Discharge status: Home / Self Care | Payer: Medicare Other | Attending: Internal Medicine | Admitting: Internal Medicine

## 2022-07-11 ENCOUNTER — Emergency Department: Payer: Medicare Other

## 2022-07-11 DIAGNOSIS — Z86718 Personal history of other venous thrombosis and embolism: Secondary | ICD-10-CM

## 2022-07-11 DIAGNOSIS — K219 Gastro-esophageal reflux disease without esophagitis: Secondary | ICD-10-CM | POA: Diagnosis present

## 2022-07-11 DIAGNOSIS — Z6841 Body Mass Index (BMI) 40.0 and over, adult: Secondary | ICD-10-CM

## 2022-07-11 DIAGNOSIS — Z79899 Other long term (current) drug therapy: Secondary | ICD-10-CM | POA: Diagnosis not present

## 2022-07-11 DIAGNOSIS — I10 Essential (primary) hypertension: Secondary | ICD-10-CM | POA: Diagnosis present

## 2022-07-11 DIAGNOSIS — Z885 Allergy status to narcotic agent status: Secondary | ICD-10-CM

## 2022-07-11 DIAGNOSIS — Z9049 Acquired absence of other specified parts of digestive tract: Secondary | ICD-10-CM | POA: Diagnosis not present

## 2022-07-11 DIAGNOSIS — Z90710 Acquired absence of both cervix and uterus: Secondary | ICD-10-CM | POA: Diagnosis not present

## 2022-07-11 DIAGNOSIS — I739 Peripheral vascular disease, unspecified: Secondary | ICD-10-CM | POA: Diagnosis present

## 2022-07-11 DIAGNOSIS — J45909 Unspecified asthma, uncomplicated: Secondary | ICD-10-CM | POA: Diagnosis present

## 2022-07-11 DIAGNOSIS — Z91048 Other nonmedicinal substance allergy status: Secondary | ICD-10-CM | POA: Diagnosis not present

## 2022-07-11 DIAGNOSIS — Z87891 Personal history of nicotine dependence: Secondary | ICD-10-CM

## 2022-07-11 DIAGNOSIS — G4733 Obstructive sleep apnea (adult) (pediatric): Secondary | ICD-10-CM | POA: Diagnosis present

## 2022-07-11 DIAGNOSIS — E876 Hypokalemia: Secondary | ICD-10-CM | POA: Diagnosis present

## 2022-07-11 DIAGNOSIS — Z886 Allergy status to analgesic agent status: Secondary | ICD-10-CM

## 2022-07-11 DIAGNOSIS — Z800 Family history of malignant neoplasm of digestive organs: Secondary | ICD-10-CM

## 2022-07-11 DIAGNOSIS — Z8249 Family history of ischemic heart disease and other diseases of the circulatory system: Secondary | ICD-10-CM

## 2022-07-11 DIAGNOSIS — Z881 Allergy status to other antibiotic agents status: Secondary | ICD-10-CM | POA: Diagnosis not present

## 2022-07-11 DIAGNOSIS — K56609 Unspecified intestinal obstruction, unspecified as to partial versus complete obstruction: Secondary | ICD-10-CM | POA: Diagnosis present

## 2022-07-11 DIAGNOSIS — D72829 Elevated white blood cell count, unspecified: Secondary | ICD-10-CM | POA: Diagnosis not present

## 2022-07-11 DIAGNOSIS — Z880 Allergy status to penicillin: Secondary | ICD-10-CM | POA: Diagnosis not present

## 2022-07-11 DIAGNOSIS — Z981 Arthrodesis status: Secondary | ICD-10-CM

## 2022-07-11 DIAGNOSIS — Z888 Allergy status to other drugs, medicaments and biological substances status: Secondary | ICD-10-CM | POA: Diagnosis not present

## 2022-07-11 LAB — URINALYSIS, ROUTINE W REFLEX MICROSCOPIC
Bilirubin Urine: NEGATIVE
Glucose, UA: NEGATIVE mg/dL
Hgb urine dipstick: NEGATIVE
Ketones, ur: NEGATIVE mg/dL
Leukocytes,Ua: NEGATIVE
Nitrite: NEGATIVE
Protein, ur: NEGATIVE mg/dL
Specific Gravity, Urine: 1.013 (ref 1.005–1.030)
pH: 5 (ref 5.0–8.0)

## 2022-07-11 LAB — CBC
HCT: 44.8 % (ref 36.0–46.0)
Hemoglobin: 15.2 g/dL — ABNORMAL HIGH (ref 12.0–15.0)
MCH: 29 pg (ref 26.0–34.0)
MCHC: 33.9 g/dL (ref 30.0–36.0)
MCV: 85.3 fL (ref 80.0–100.0)
Platelets: 301 10*3/uL (ref 150–400)
RBC: 5.25 MIL/uL — ABNORMAL HIGH (ref 3.87–5.11)
RDW: 13.2 % (ref 11.5–15.5)
WBC: 12.6 10*3/uL — ABNORMAL HIGH (ref 4.0–10.5)
nRBC: 0 % (ref 0.0–0.2)

## 2022-07-11 LAB — COMPREHENSIVE METABOLIC PANEL
ALT: 62 U/L — ABNORMAL HIGH (ref 0–44)
AST: 47 U/L — ABNORMAL HIGH (ref 15–41)
Albumin: 3.7 g/dL (ref 3.5–5.0)
Alkaline Phosphatase: 41 U/L (ref 38–126)
Anion gap: 10 (ref 5–15)
BUN: 12 mg/dL (ref 8–23)
CO2: 28 mmol/L (ref 22–32)
Calcium: 9.6 mg/dL (ref 8.9–10.3)
Chloride: 100 mmol/L (ref 98–111)
Creatinine, Ser: 0.96 mg/dL (ref 0.44–1.00)
GFR, Estimated: >60 mL/min (ref >60)
Glucose, Bld: 134 mg/dL — ABNORMAL HIGH (ref 70–99)
Potassium: 3.6 mmol/L (ref 3.5–5.1)
Sodium: 138 mmol/L (ref 135–145)
Total Bilirubin: 1.4 mg/dL — ABNORMAL HIGH (ref 0.3–1.2)
Total Protein: 7.6 g/dL (ref 6.5–8.1)

## 2022-07-11 LAB — LIPASE, BLOOD: Lipase: 30 U/L (ref 11–51)

## 2022-07-11 MED ORDER — ACETAMINOPHEN 650 MG RE SUPP: 650.0000 mg | Freq: Four times a day (QID) | RECTAL | Status: DC | PRN

## 2022-07-11 MED ORDER — TRAZODONE HCL 50 MG PO TABS: 25.0000 mg | ORAL_TABLET | Freq: Every evening | ORAL | Status: DC | PRN

## 2022-07-11 MED ORDER — ACETAMINOPHEN 325 MG PO TABS: 650.0000 mg | ORAL_TABLET | Freq: Four times a day (QID) | ORAL | Status: DC | PRN

## 2022-07-11 MED ORDER — PANTOPRAZOLE SODIUM 40 MG PO TBEC
40.0000 mg | DELAYED_RELEASE_TABLET | Freq: Every day | ORAL | Status: DC
  Filled 2022-07-11: qty 1

## 2022-07-11 MED ORDER — ENOXAPARIN SODIUM 60 MG/0.6ML IJ SOSY
0.5000 mg/kg | PREFILLED_SYRINGE | INTRAMUSCULAR | Status: DC
  Administered 2022-07-14 – 2022-07-17 (×4): 55 mg via SUBCUTANEOUS
  Filled 2022-07-11 (×5): qty 0.6

## 2022-07-11 MED ORDER — ONDANSETRON 4 MG PO TBDP
4.0000 mg | ORAL_TABLET | Freq: Once | ORAL | Status: AC
  Administered 2022-07-11: 4 mg via ORAL
  Filled 2022-07-11: qty 1

## 2022-07-11 MED ORDER — ONDANSETRON HCL 4 MG PO TABS: 4.0000 mg | ORAL_TABLET | Freq: Four times a day (QID) | ORAL | Status: DC | PRN

## 2022-07-11 MED ORDER — POTASSIUM CHLORIDE CRYS ER 10 MEQ PO TBCR
10.0000 meq | EXTENDED_RELEASE_TABLET | Freq: Every day | ORAL | Status: DC
  Filled 2022-07-11: qty 1

## 2022-07-11 MED ORDER — POTASSIUM CHLORIDE IN NACL 20-0.9 MEQ/L-% IV SOLN
INTRAVENOUS | Status: DC
  Filled 2022-07-11 (×12): qty 1000

## 2022-07-11 MED ORDER — METAXALONE 800 MG PO TABS
800.0000 mg | ORAL_TABLET | Freq: Two times a day (BID) | ORAL | Status: DC
  Administered 2022-07-12 – 2022-07-17 (×9): 800 mg via ORAL
  Filled 2022-07-11 (×11): qty 1

## 2022-07-11 MED ORDER — IRBESARTAN 150 MG PO TABS
75.0000 mg | ORAL_TABLET | Freq: Every day | ORAL | Status: DC
  Administered 2022-07-14 – 2022-07-17 (×4): 75 mg via ORAL
  Filled 2022-07-11 (×6): qty 1

## 2022-07-11 MED ORDER — LIDOCAINE HCL (PF) 1 % IJ SOLN
INTRAMUSCULAR | Status: AC
  Filled 2022-07-11: qty 5

## 2022-07-11 MED ORDER — DULOXETINE HCL 30 MG PO CPEP
60.0000 mg | ORAL_CAPSULE | Freq: Every day | ORAL | Status: DC
  Administered 2022-07-14 – 2022-07-17 (×4): 60 mg via ORAL
  Filled 2022-07-11 (×5): qty 2

## 2022-07-11 MED ORDER — FUROSEMIDE 40 MG PO TABS
40.0000 mg | ORAL_TABLET | Freq: Every day | ORAL | Status: DC
  Administered 2022-07-14 – 2022-07-17 (×4): 40 mg via ORAL
  Filled 2022-07-11 (×5): qty 1

## 2022-07-11 MED ORDER — LORAZEPAM 2 MG/ML IJ SOLN
1.0000 mg | Freq: Once | INTRAMUSCULAR | Status: AC
  Administered 2022-07-11: 1 mg via INTRAVENOUS
  Filled 2022-07-11: qty 1

## 2022-07-11 MED ORDER — MIDAZOLAM HCL 2 MG/2ML IJ SOLN
1.0000 mg | Freq: Once | INTRAMUSCULAR | Status: AC
  Administered 2022-07-11: 1 mg via INTRAVENOUS
  Filled 2022-07-11: qty 2

## 2022-07-11 MED ORDER — BISOPROLOL-HYDROCHLOROTHIAZIDE 2.5-6.25 MG PO TABS
1.0000 | ORAL_TABLET | Freq: Every morning | ORAL | Status: DC
  Administered 2022-07-14: 1 via ORAL
  Filled 2022-07-11 (×4): qty 1

## 2022-07-11 MED ORDER — IOHEXOL 300 MG/ML  SOLN
100.0000 mL | Freq: Once | INTRAMUSCULAR | Status: AC | PRN
  Administered 2022-07-11: 100 mL via INTRAVENOUS

## 2022-07-11 MED ORDER — OYSTER SHELL CALCIUM/D3 500-5 MG-MCG PO TABS
ORAL_TABLET | Freq: Every day | ORAL | Status: DC
  Administered 2022-07-15 – 2022-07-17 (×3): 1 via ORAL
  Filled 2022-07-11 (×4): qty 1

## 2022-07-11 MED ORDER — LEVOCETIRIZINE DIHYDROCHLORIDE 5 MG PO TABS: 5.0000 mg | ORAL_TABLET | Freq: Every evening | ORAL | Status: DC

## 2022-07-11 MED ORDER — ONDANSETRON HCL 4 MG/2ML IJ SOLN
4.0000 mg | Freq: Four times a day (QID) | INTRAMUSCULAR | Status: DC | PRN
  Administered 2022-07-12 – 2022-07-16 (×2): 4 mg via INTRAVENOUS
  Filled 2022-07-11 (×2): qty 2

## 2022-07-11 NOTE — Assessment & Plan Note (Addendum)
Appreciate surgical consultation.  Patient had NG tube removed this morning.  General surgery advance diet to soft diet.  If tolerates eating today may be able to get out of the hospital tomorrow.

## 2022-07-11 NOTE — ED Provider Notes (Signed)
[facility] Emergency Department Provider Note     Event Date/Time   First MD Initiated Contact with Patient 07/11/22 1907     (approximate)   History   Diarrhea and Emesis   HPI  Kelly Perry is a 71 y.o. female history of morbid obesity, mitral valve prolapse, hypertension, GERD, as well as a surgical history consistent with cholecystectomy, left hemicolectomy with colostomy and transection of rectal mass, presents to the ED for evaluation. Pt complains of several days of diarrhea, emesis, bloating, and excessive foul-smelling belching. She has had symptoms for the last several days. She notes similar symptoms prior to her cholecystectomy. She denies sick contacts.   Physical Exam   Triage Vital Signs: ED Triage Vitals  Enc Vitals Group     BP 07/11/22 1742 109/72     Pulse Rate 07/11/22 1742 92     Resp 07/11/22 1742 18     Temp 07/11/22 1742 99 F (37.2 C)     Temp Source 07/11/22 1742 Oral     SpO2 07/11/22 1742 94 %     Weight 07/11/22 1745 245 lb (111.1 kg)     Height 07/11/22 1745 '5\' 4"'$  (1.626 m)     Head Circumference --      Peak Flow --      Pain Score 07/11/22 1745 7     Pain Loc --      Pain Edu? --      Excl. in [place]? --     Most recent vital signs: Vitals:   07/11/22 1742  BP: 109/72  Pulse: 92  Resp: 18  Temp: 99 F (37.2 C)  SpO2: 94%    General Awake, no distress. NAD CV:  Good peripheral perfusion.  RESP:  Normal effort.  ABD:  Soft, but with distention.    ED Results / Procedures / Treatments   Labs (all labs ordered are listed, but only abnormal results are displayed) Labs Reviewed  COMPREHENSIVE METABOLIC PANEL - Abnormal; Notable for the following components:      Result Value   Glucose, Bld 134 (*)    AST 47 (*)    ALT 62 (*)    Total Bilirubin 1.4 (*)    All other components within normal limits  CBC - Abnormal; Notable for the following components:   WBC 12.6 (*)    RBC 5.25 (*)     Hemoglobin 15.2 (*)    All other components within normal limits  URINALYSIS, ROUTINE W REFLEX MICROSCOPIC - Abnormal; Notable for the following components:   Color, Urine YELLOW (*)    APPearance CLOUDY (*)    All other components within normal limits  LIPASE, BLOOD     EKG   RADIOLOGY  I personally viewed and evaluated these images as part of my medical decision making, as well as reviewing the written report by the radiologist.  ED Provider Interpretation: dilated small bowel loops noted  CT ABDOMEN PELVIS W CONTRAST  Result Date: 07/11/2022 CLINICAL DATA:  Diarrhea, vomiting, excessive belching EXAM: CT ABDOMEN AND PELVIS WITH CONTRAST TECHNIQUE: Multidetector CT imaging of the abdomen and pelvis was performed using the standard protocol following bolus administration of intravenous contrast. RADIATION DOSE REDUCTION: This exam was performed according to the departmental dose-optimization program which includes automated exposure control, adjustment of the mA and/or kV according to patient size and/or use of iterative reconstruction technique. CONTRAST:  150m OMNIPAQUE IOHEXOL 300 MG/ML  SOLN COMPARISON:  01/03/2013 FINDINGS: Lower  chest: Lung bases are clear. Hepatobiliary: Liver is within normal limits. Status post cholecystectomy. No intrahepatic or extrahepatic duct dilatation. Pancreas: Within normal limits. Spleen: Within normal limits Adrenals/Urinary Tract: Dual glands are within normal limits. Kidneys are within normal limits.  No hydronephrosis. Bladder is within normal limits. Stomach/Bowel: Stomach is within normal limits. Dilated loops of small bowel in the left mid abdomen, with decompressed loops in the right lower quadrant, suggesting small bowel obstruction. Mildly angulated loop in the right mid/lower abdomen (series 2/image 68), relative transition, suggesting adhesions. Appendix is not discretely visualized. Status post left hemicolectomy with suture line in the left  lower pelvis (series 2/image 69). Mild left colonic diverticulosis, without evidence of diverticulitis. Vascular/Lymphatic: No evidence of abdominal aortic aneurysm. No suspicious abdominopelvic lymphadenopathy. Reproductive: Status post hysterectomy. No adnexal masses. Other: No abdominopelvic ascites. Musculoskeletal: Visualized osseous structures are within normal limits. IMPRESSION: Small bowel obstruction, with suspected adhesions in the right mid/lower abdomen. Status post left hemicolectomy. Mild left colonic diverticulosis, without evidence of diverticulitis. Additional ancillary findings as above. Electronically Signed   By: Julian Hy M.D.   On: 07/11/2022 20:42     PROCEDURES:  Critical Care performed: No  Procedures   MEDICATIONS ORDERED IN ED: Medications  LORazepam (ATIVAN) injection 1 mg (has no administration in time range)  ondansetron (ZOFRAN-ODT) disintegrating tablet 4 mg (4 mg Oral Given 07/11/22 1811)  iohexol (OMNIPAQUE) 300 MG/ML solution 100 mL (100 mLs Intravenous Contrast Given 07/11/22 2019)     IMPRESSION / MDM / ASSESSMENT AND PLAN / ED COURSE  I reviewed the triage vital signs and the nursing notes.                              Differential diagnosis includes, but is not limited to, acute appendicitis, diverticulitis, urinary tract infection/pyelonephritis, endometriosis, bowel obstruction, colitis, renal colic, gastroenteritis, hernia   Patient's presentation is most consistent with acute complicated illness / injury requiring diagnostic workup.  ----------------------------------------- 9:48 PM on 07/11/2022 ----------------------------------------- S/W Dr. Peyton Najjar: he requests NG tube placement and admission to the hospitalist service.   Patient to the ED for evaluation of several days of abdominal pain, distention with nausea and vomiting patient also experiencing some malodorous belching.  She is evaluated for complaints in ED, found to have  mild leukocytosis on exam.  Further evaluation with CT imaging reveals dilated loops of small bowel with some concern for some adhesions.  Patient's diagnosis is consistent with SBO. Patient will be admitted to the hospital service for surgery consultation.  Patient is aware of the diagnosis, treatment plan, and need for admission to the hospital.  FINAL CLINICAL IMPRESSION(S) / ED DIAGNOSES   Final diagnoses:  SBO (small bowel obstruction) ([place])     Rx / DC Orders   ED Discharge Orders     None        Note:  This document was prepared using Dragon voice recognition software and may include unintentional dictation errors.    Melvenia Needles, PA-C 07/11/22 2151    Blake Divine, MD 07/12/22 2314

## 2022-07-11 NOTE — Progress Notes (Signed)
PHARMACIST - PHYSICIAN COMMUNICATION  CONCERNING:  Enoxaparin (Lovenox) for DVT Prophylaxis    RECOMMENDATION: Patient was prescribed enoxaprin '40mg'$  q24 hours for VTE prophylaxis.   Filed Weights   07/11/22 1745  Weight: 111.1 kg (245 lb)    Body mass index is 42.05 kg/m.  Estimated Creatinine Clearance: 65.6 mL/min (by C-G formula based on SCr of 0.96 mg/dL).   Based on [place] patient is candidate for enoxaparin 0.'5mg'$ /kg TBW SQ every 24 hours based on BMI being >30.  DESCRIPTION: Pharmacy has adjusted enoxaparin dose per [facility] policy.  Patient is now receiving enoxaparin 0.5 mg/kg every 24 hours   Renda Rolls, PharmD, [facility] 07/11/2022 10:34 PM

## 2022-07-11 NOTE — Assessment & Plan Note (Signed)
Resolved

## 2022-07-11 NOTE — ED Notes (Signed)
Three attempts made by two different Rns for NG tube palcement. Unsuccessful. PA made aware.

## 2022-07-11 NOTE — ED Provider Triage Note (Signed)
Emergency Medicine Provider Triage Evaluation Note  Kelly Perry, a 71 y.o. female  was evaluated in triage.  Pt complains of diarrhea, emesis, and excessive belching. She has had symptoms for the last several days. She notes similar symptoms prior to her cholecystectomy. She denies sick contacts.  Review of Systems  Positive: NVD, belching Negative: FCS  Physical Exam  BP 109/72 (BP Location: Left Arm)   Pulse 92   Temp 99 F (37.2 C) (Oral)   Resp 18   Ht '5\' 4"'$  (1.626 m)   Wt 111.1 kg   SpO2 94%   BMI 42.05 kg/m  Gen:   Awake, no distress  NAD Resp:  Normal effort CTA MSK:   Moves extremities without difficulty  ABD:  Soft, nontender  Medical Decision Making  Medically screening exam initiated at 5:54 PM.  Appropriate orders placed.  Kelly Perry was informed that the remainder of the evaluation will be completed by another provider, this initial triage assessment does not replace that evaluation, and the importance of remaining in the ED until their evaluation is complete.  Patient to the ED for evaluation of several days worth of nausea, vomiting, and diarrhea.   Kelly Needles, PA-C 07/11/22 1757

## 2022-07-11 NOTE — Assessment & Plan Note (Addendum)
CPAP nightly

## 2022-07-11 NOTE — H&P (Signed)
[name]   PATIENT NAME: Kelly Perry    MR#:  [id number]  DATE OF BIRTH:  [birth year]  DATE OF ADMISSION:  07/11/2022  PRIMARY CARE PHYSICIAN: Rusty Aus, MD   Patient is coming from: Home  REQUESTING/REFERRING PHYSICIAN: Menshew, Dannielle Karvonen, PA-C   CHIEF COMPLAINT:   Chief Complaint  Patient presents with   Diarrhea   Emesis    HISTORY OF PRESENT ILLNESS:  Kelly Perry is a 71 y.o.Caucasian female with medical history significant for osteoarthritis, asthma, GERD, hypertension, mitral valve prolapse, peripheral vascular disease and left lower extremity DVT as well as OSA, with previous history of cholecystectomy, left hemicolectomy and colostomy and transection of rectal mass, who presented to the emergency room with acute onset of intractable nausea and vomiting with diarrhea with associated bloating and excessive foul-smelling belching. She admits to associated mid abdominal wall bandlike pain.  She denies any bilious vomitus or hematemesis.  No melena or bright red being per rectum.  No hematemesis or jaundice.  No fever or chills.  No dysuria, oliguria or hematuria or flank pain.  No cough or wheezing or hemoptysis.  No chest pain or palpitations.  ED Course: Upon presentation to the emergency room, temperature was 99 and vital signs were otherwise within normal.  Labs revealed borderline potassium of 3.6 and AST 47 and ALT 62 with total bili of 1.4.CBC showed leukocytosis of 12.6 with hemoconcentration. UA was negative.  Imaging:  Abdominal pelvic CT scan with contrast showed small bowel obstruction with suspected adhesions in the right mid/lower abdomen.  It showed her left hemicolectomy and mild left colonic diverticulosis without diverticulitis.  The patient was given 1 mg of IV Ativan and 4 mg of Zofran ODT. She will be admitted to the medical bed for further evaluation and management. PAST MEDICAL HISTORY:   Past Medical History:  Diagnosis  Date   Arthritis    Asthma    wheezing with URI- no inhalers day to day   Cervical disc herniation    Colon adenomas    Deep vein thrombosis (DVT) during current hospitalization ([place])    Easy bruising    GERD (gastroesophageal reflux disease)    Hypertension    Mitral valve prolapse    eccho 7/13 chart   Peripheral vascular disease ([place]) 7/12   DVT  left leg  post op- coumadin x 6 months   Sleep apnea     PAST SURGICAL HISTORY:   Past Surgical History:  Procedure Laterality Date   ABDOMINAL HYSTERECTOMY  2007   total   APPENDECTOMY     CERVICAL SPINE SURGERY     CHOLECYSTECTOMY     COLON SURGERY  06/30/2010   colectomy, Dr Pat Patrick   COLONOSCOPY N/A 04/28/2022   Procedure: COLONOSCOPY;  Surgeon: Toledo, Benay Pike, MD;  Location: [facility] ENDOSCOPY;  Service: Gastroenterology;  Laterality: N/A;   COLONOSCOPY WITH PROPOFOL N/A 02/13/2018   Procedure: COLONOSCOPY WITH PROPOFOL;  Surgeon: Lollie Sails, MD;  Location: [facility] ENDOSCOPY;  Service: Endoscopy;  Laterality: N/A;   COLOSTOMY  2011   Dr Pat Patrick   COLOSTOMY TAKEDOWN  02/2011   Dr Haskell Riling SIGMOIDOSCOPY N/A 06/05/2018   Procedure: FLEXIBLE SIGMOIDOSCOPY;  Surgeon: Lollie Sails, MD;  Location: [facility] ENDOSCOPY;  Service: Endoscopy;  Laterality: N/A;   HERNIA REPAIR     INSERTION OF MESH N/A 02/26/2013   Procedure: INSERTION OF MESH;  Surgeon: Imogene Burn. Tsuei, MD;  Location: [facility] ORS;  Service: General;  Laterality: N/A;   LYSIS OF ADHESION N/A 02/26/2013   Procedure: LYSIS OF ADHESION;  Surgeon: Imogene Burn. Georgette Dover, MD;  Location: [facility] ORS;  Service: General;  Laterality: N/A;   SPINE SURGERY  2010   cervical neck fusion/  LIMITED MOBILITY   TONSILLECTOMY     TRANSANAL EXCISION OF RECTAL MASS N/A 11/10/2018   Procedure: TRANSANAL EXCISION OF RECTAL POLYP;  Surgeon: Robert Bellow, MD;  Location: [facility] ORS;  Service: General;  Laterality: N/A;   VENTRAL HERNIA REPAIR N/A 02/26/2013   Procedure: HERNIA REPAIR VENTRAL  ADULT;  Surgeon: Imogene Burn. Georgette Dover, MD;  Location: [facility] ORS;  Service: General;  Laterality: N/A;    SOCIAL HISTORY:   Social History   Tobacco Use   Smoking status: Former    Packs/day: 1.00    Years: 10.00    Total pack years: 10.00    Types: Cigarettes    Quit date: 01/16/1985    Years since quitting: 37.5   Smokeless tobacco: Never  Substance Use Topics   Alcohol use: Yes    Comment: occ    FAMILY HISTORY:   Family History  Problem Relation Age of Onset   Arthritis Mother    Hypertension Mother    Breast cancer Paternal Aunt 40   Colon cancer Maternal Grandfather 59   Colon cancer Cousin        maternal    DRUG ALLERGIES:   Allergies  Allergen Reactions   Morphine And Related Other (See Comments)    Elephant on chest After IV push med delivered can tolerate slow delivery per pt 10/16/2018   Novocain [Procaine] Other (See Comments)    Blackout (as a child)   Aspirin Other (See Comments)    Ringing in the ears   Doxycycline Hyclate Diarrhea   Elemental Sulfur Other (See Comments)    sweating   Levaquin [Levofloxacin In D5w] Diarrhea   Penicillins Rash   Tape Other (See Comments)    USE PAPER TAPE    REVIEW OF SYSTEMS:   ROS As per history of present illness. All pertinent systems were reviewed above. Constitutional, HEENT, cardiovascular, respiratory, GI, GU, musculoskeletal, neuro, psychiatric, endocrine, integumentary and hematologic systems were reviewed and are otherwise negative/unremarkable except for positive findings mentioned above in the HPI.   MEDICATIONS AT HOME:   Prior to Admission medications   Medication Sig Start Date End Date Taking? Authorizing Provider  clindamycin (CLEOCIN T) 1 % external solution Apply 1 Application topically 2 (two) times daily as needed. 06/13/22  Yes [provider]  acetaminophen (TYLENOL) 650 MG CR tablet Take 1,300 mg by mouth every 8 (eight) hours as needed for pain.    [provider]   bisoprolol-hydrochlorothiazide [facility]) 2.5-6.25 MG per tablet Take 1 tablet by mouth every morning.  11/24/12   [provider]  BOSWELLIA SERRATA PO Take by mouth. Patient not taking: Reported on 04/02/2020    [provider]  Calcium Carb-Cholecalciferol (CALCIUM 600/VITAMIN D3 PO) Take 1 tablet by mouth daily.    [provider]  clotrimazole-betamethasone (LOTRISONE) cream Apply 1 application topically 2 (two) times daily. 04/02/20   Gae Dry, MD  DULoxetine (CYMBALTA) 60 MG capsule Take 60 mg by mouth daily.  01/14/13   [provider]  esomeprazole (NEXIUM) 40 MG capsule Take 40 mg by mouth daily.    [provider]  furosemide (LASIX) 20 MG tablet Take 40 mg by mouth daily.     [provider]  hydrocortisone (ANUSOL-HC) 2.5 % rectal cream Place 1 application rectally 2 (two) times daily. 11/23/18   Robert Bellow, MD  levocetirizine (XYZAL) 5 MG tablet Take by mouth.    [provider]  metaxalone (SKELAXIN) 800 MG tablet Take 800 mg by mouth 2 (two) times daily.  01/15/13   [provider]  OVER THE COUNTER MEDICATION Apply 1 application topically every 6 (six) hours as needed (for pain). Toprisin Topical Cream    [provider]  OVER THE COUNTER MEDICATION Take 2 capsules by mouth 3 (three) times daily. Turmeric Rose Mary Leaf Dogwood Bark Supplement    [provider]  OVER THE COUNTER MEDICATION 1-2 tablets daily. [place]    [provider]  potassium chloride (K-DUR) 10 MEQ tablet Take 10 mEq by mouth daily.     [provider]  potassium chloride (KLOR-CON) 10 MEQ tablet Take by mouth. 12/11/19   [provider]  telmisartan (MICARDIS) 40 MG tablet Take 40 mg by mouth daily.    [provider]      VITAL SIGNS:  Blood pressure 109/72, pulse 92, temperature 99 F (37.2 C), temperature source Oral, resp. rate 18, height '5\' 4"'$  (1.626 m), weight 111.1  kg, SpO2 94 %.  PHYSICAL EXAMINATION:  Physical Exam  GENERAL:  71 y.o.-year-old Caucasian female patient lying in the bed with no acute distress.  EYES: Pupils equal, round, reactive to light and accommodation. No scleral icterus. Extraocular muscles intact.  HEENT: Head atraumatic, normocephalic. Oropharynx and nasopharynx clear.  NECK:  Supple, no jugular venous distention. No thyroid enlargement, no tenderness.  LUNGS: Normal breath sounds bilaterally, no wheezing, rales,rhonchi or crepitation. No use of accessory muscles of respiration.  CARDIOVASCULAR: Regular rate and rhythm, S1, S2 normal. No murmurs, rubs, or gallops.  ABDOMEN: Soft, mildly distended, nontender. Bowel sounds are significant diminished. No organomegaly or mass.  EXTREMITIES: No pedal edema, cyanosis, or clubbing.  NEUROLOGIC: Cranial nerves II through XII are intact. Muscle strength 5/5 in all extremities. Sensation intact. Gait not checked.  PSYCHIATRIC: The patient is alert and oriented x 3.  Normal affect and good eye contact. SKIN: No obvious rash, lesion, or ulcer.   LABORATORY PANEL:   CBC Recent Labs  Lab 07/11/22 1747  WBC 12.6*  HGB 15.2*  HCT 44.8  PLT 301   ------------------------------------------------------------------------------------------------------------------  Chemistries  Recent Labs  Lab 07/11/22 1747  NA 138  K 3.6  CL 100  CO2 28  GLUCOSE 134*  BUN 12  CREATININE 0.96  CALCIUM 9.6  AST 47*  ALT 62*  ALKPHOS 41  BILITOT 1.4*   ------------------------------------------------------------------------------------------------------------------  Cardiac Enzymes No results for input(s): "TROPONINI" in the last 168 hours. ------------------------------------------------------------------------------------------------------------------  RADIOLOGY:  CT ABDOMEN PELVIS W CONTRAST  Result Date: 07/11/2022 CLINICAL DATA:  Diarrhea, vomiting, excessive belching EXAM: CT  ABDOMEN AND PELVIS WITH CONTRAST TECHNIQUE: Multidetector CT imaging of the abdomen and pelvis was performed using the standard protocol following bolus administration of intravenous contrast. RADIATION DOSE REDUCTION: This exam was performed according to the departmental dose-optimization program which includes automated exposure control, adjustment of the mA and/or kV according to patient size and/or use of iterative reconstruction technique. CONTRAST:  148m OMNIPAQUE IOHEXOL 300 MG/ML  SOLN COMPARISON:  01/03/2013 FINDINGS: Lower chest: Lung bases are clear. Hepatobiliary: Liver is within normal limits. Status post cholecystectomy. No intrahepatic or extrahepatic duct dilatation. Pancreas: Within normal limits. Spleen: Within normal limits Adrenals/Urinary Tract: Dual glands are within normal limits. Kidneys  are within normal limits.  No hydronephrosis. Bladder is within normal limits. Stomach/Bowel: Stomach is within normal limits. Dilated loops of small bowel in the left mid abdomen, with decompressed loops in the right lower quadrant, suggesting small bowel obstruction. Mildly angulated loop in the right mid/lower abdomen (series 2/image 68), relative transition, suggesting adhesions. Appendix is not discretely visualized. Status post left hemicolectomy with suture line in the left lower pelvis (series 2/image 69). Mild left colonic diverticulosis, without evidence of diverticulitis. Vascular/Lymphatic: No evidence of abdominal aortic aneurysm. No suspicious abdominopelvic lymphadenopathy. Reproductive: Status post hysterectomy. No adnexal masses. Other: No abdominopelvic ascites. Musculoskeletal: Visualized osseous structures are within normal limits. IMPRESSION: Small bowel obstruction, with suspected adhesions in the right mid/lower abdomen. Status post left hemicolectomy. Mild left colonic diverticulosis, without evidence of diverticulitis. Additional ancillary findings as above. Electronically Signed    By: Julian Hy M.D.   On: 07/11/2022 20:42      IMPRESSION AND PLAN:  Assessment and Plan: * SBO (small bowel obstruction) ([place]) - The patient will be admitted to a medical bed. - This is clearly secondary to adhesions from previous laparotomies. - She will be kept n.p.o. except for medications. - She will be hydrated with IV normal saline with added potassium chloride. - We will follow two-view abdomen x-ray in AM. - General surgery consult will be obtained. - I notified Dr. Peyton Najjar about patient..  Leukocytosis - This likely secondary to stress demargination.  We will follow CBC.  Essential hypertension - We will continue her antihypertensives.  GERD without esophagitis - She will be on PPI therapy.  OSA on CPAP - We will resume CPAP nightly.   DVT prophylaxis: Lovenox.  Advanced Care Planning:  Code Status: full code.  Family Communication:  The plan of care was discussed in details with the patient (and family). I answered all questions. The patient agreed to proceed with the above mentioned plan. Further management will depend upon hospital course. Disposition Plan: Back to previous home environment Consults called: General surgery All the records are reviewed and case discussed with ED provider.  Status is: Inpatient  At the time of the admission, it appears that the appropriate admission status for this patient is inpatient.  This is judged to be reasonable and necessary in order to provide the required intensity of service to ensure the patient's safety given the presenting symptoms, physical exam findings and initial radiographic and laboratory data in the context of comorbid conditions.  The patient requires inpatient status due to high intensity of service, high risk of further deterioration and high frequency of surveillance required.  I certify that at the time of admission, it is my clinical judgment that the patient will require inpatient hospital care  extending more than 2 midnights.                            Dispo: The patient is from: Home              Anticipated d/c is to: Home              Patient currently is not medically stable to d/c.              Difficult to place patient: No  Christel Mormon M.D on 07/11/2022 at 10:49 PM  [facility]   From 7 PM-7 AM, contact night-coverage [email or web address]  CC: Primary care physician; Rusty Aus, MD

## 2022-07-11 NOTE — ED Triage Notes (Signed)
Patient c/o diarrhea and emesis. Reports abdominal pain and distended abdomen today.   History gallbladder removal

## 2022-07-11 NOTE — Assessment & Plan Note (Addendum)
on PPI therapy.

## 2022-07-11 NOTE — Progress Notes (Signed)
       CROSS COVER NOTE  NAME: Kelly Perry MRN: [id number] DOB : 07/30/1951 ATTENDING PHYSICIAN: Christel Mormon, MD    Date of Service   07/11/2022   HPI/Events of Note   Message received from nursing requesting order for home CPAP be entered. Patient is no longer having nausea and is alert enough to remove CPAP if needed.  Interventions   Assessment/Plan:  OSA CPAP     This document was prepared using Dragon voice recognition software and may include unintentional dictation errors.  Neomia Glass DNP, MBA, FNP-BC Nurse Practitioner [facility] Pager [phone]

## 2022-07-11 NOTE — ED Notes (Signed)
Ativan didn't work in calming patient for NG tube.

## 2022-07-11 NOTE — Assessment & Plan Note (Addendum)
Can continue Lasix, bisoprolol as outpatient.  Hold hydrochlorothiazide with hypokalemia.  Hold telmisartan with lower blood pressure today.  I recommended getting a blood pressure cuff for home.  Follow-up with PCP to recheck blood pressure

## 2022-07-12 ENCOUNTER — Inpatient Hospital Stay: Payer: Medicare Other

## 2022-07-12 ENCOUNTER — Inpatient Hospital Stay: Payer: Medicare Other | Admitting: Radiology

## 2022-07-12 ENCOUNTER — Encounter: Payer: Self-pay | Admitting: Family Medicine

## 2022-07-12 DIAGNOSIS — K56609 Unspecified intestinal obstruction, unspecified as to partial versus complete obstruction: Secondary | ICD-10-CM | POA: Diagnosis not present

## 2022-07-12 DIAGNOSIS — E876 Hypokalemia: Secondary | ICD-10-CM | POA: Diagnosis not present

## 2022-07-12 HISTORY — PX: IR NASO G TUBE PLC W/FL W/RAD: IMG2321

## 2022-07-12 LAB — BASIC METABOLIC PANEL
Anion gap: 10 (ref 5–15)
BUN: 12 mg/dL (ref 8–23)
CO2: 27 mmol/L (ref 22–32)
Calcium: 9 mg/dL (ref 8.9–10.3)
Chloride: 100 mmol/L (ref 98–111)
Creatinine, Ser: 0.82 mg/dL (ref 0.44–1.00)
GFR, Estimated: >60 mL/min (ref >60)
Glucose, Bld: 115 mg/dL — ABNORMAL HIGH (ref 70–99)
Potassium: 3.3 mmol/L — ABNORMAL LOW (ref 3.5–5.1)
Sodium: 137 mmol/L (ref 135–145)

## 2022-07-12 LAB — CBC
HCT: 40.4 % (ref 36.0–46.0)
Hemoglobin: 13.9 g/dL (ref 12.0–15.0)
MCH: 29.4 pg (ref 26.0–34.0)
MCHC: 34.4 g/dL (ref 30.0–36.0)
MCV: 85.6 fL (ref 80.0–100.0)
Platelets: 238 10*3/uL (ref 150–400)
RBC: 4.72 MIL/uL (ref 3.87–5.11)
RDW: 13.5 % (ref 11.5–15.5)
WBC: 9.9 10*3/uL (ref 4.0–10.5)
nRBC: 0 % (ref 0.0–0.2)

## 2022-07-12 MED ORDER — PNEUMOCOCCAL 20-VAL CONJ VACC 0.5 ML IM SUSY
0.5000 mL | PREFILLED_SYRINGE | INTRAMUSCULAR | Status: DC
  Filled 2022-07-12: qty 0.5

## 2022-07-12 MED ORDER — POTASSIUM CHLORIDE 10 MEQ/100ML IV SOLN
10.0000 meq | INTRAVENOUS | Status: AC
  Administered 2022-07-12 (×3): 10 meq via INTRAVENOUS
  Filled 2022-07-12: qty 100

## 2022-07-12 MED ORDER — LIDOCAINE VISCOUS HCL 2 % MT SOLN
OROMUCOSAL | Status: AC
  Filled 2022-07-12: qty 15

## 2022-07-12 NOTE — ED Notes (Signed)
Surgical Provider at bedside.

## 2022-07-12 NOTE — Progress Notes (Signed)
Patient does not want to wear CPAP while NG tube is in place. She asked if she could be placed on O2. 2L started, no distress noted. Hospital unit remains at bedside.

## 2022-07-12 NOTE — ED Notes (Signed)
Patient denies pain and is resting comfortably.  

## 2022-07-12 NOTE — Progress Notes (Signed)
PROGRESS NOTE    Kelly Perry  NUU:[id number] DOB: February 12, 1951 DOA: 07/11/2022 PCP: Rusty Aus, MD   Assessment & Plan:   Principal Problem:   SBO (small bowel obstruction) ([place]) Active Problems:   Essential hypertension   Leukocytosis   GERD without esophagitis   OSA on CPAP  Assessment and Plan:  SBO: secondary to adhesions from previous laparotomies. NPO. Continue on IVFs. NG tube will be have to placed by IR as it was unable to be done on the floor. Gen surg following and recs apprec  Hypokalemia: potassium given  Leukocytosis: resolved   HTN: holding irbesartan, bisoprolol-HCTZ until no longer NPO   GERD: continue on PPI  OSA: CPAP qhs    Morbid obesity: BMI 42.0. Complicates overall care & prognosis    DVT prophylaxis: lovenox  Code Status: full  Family Communication:  Disposition Plan: likely d/c back home   Level of care: Med-Surg  Status is: Inpatient Remains inpatient appropriate because: severity of illness    Consultants:  Gen surg   Procedures:   Antimicrobials:   Subjective: Pt c/o feeling bloated   Objective: Vitals:   07/12/22 0600 07/12/22 1054 07/12/22 1122 07/12/22 1153  BP: 135/72  119/69 (!) 112/54  Pulse: 62  76 81  Resp:   18 18  Temp:   98.2 F (36.8 C) 98.1 F (36.7 C)  TempSrc:   Oral   SpO2: 95% 98% 96% 98%  Weight:      Height:        Intake/Output Summary (Last 24 hours) at 07/12/2022 1359 Last data filed at 07/12/2022 0300 Gross per 24 hour  Intake 326.04 ml  Output --  Net 326.04 ml   Filed Weights   07/11/22 1745  Weight: 111.1 kg    Examination:  General exam: Appears calm and comfortable. Morbidly obese Respiratory system: Clear to auscultation. Respiratory effort normal. Cardiovascular system: S1 & S2+. No  rubs, gallops or clicks. Gastrointestinal system: Abdomen is obese, soft and nontender. Hypoactive bowel sounds heard. Central nervous system: Alert and oriented. Moves all  extremities  Psychiatry: Judgement and insight appear normal. Mood & affect appropriate.     Data Reviewed: I have personally reviewed following labs and imaging studies  CBC: Recent Labs  Lab 07/11/22 1747 07/12/22 0649  WBC 12.6* 9.9  HGB 15.2* 13.9  HCT 44.8 40.4  MCV 85.3 85.6  PLT 301 347   Basic Metabolic Panel: Recent Labs  Lab 07/11/22 1747 07/12/22 0649  NA 138 137  K 3.6 3.3*  CL 100 100  CO2 28 27  GLUCOSE 134* 115*  BUN 12 12  CREATININE 0.96 0.82  CALCIUM 9.6 9.0   GFR: Estimated Creatinine Clearance: 76.8 mL/min (by C-G formula based on SCr of 0.82 mg/dL). Liver Function Tests: Recent Labs  Lab 07/11/22 1747  AST 47*  ALT 62*  ALKPHOS 41  BILITOT 1.4*  PROT 7.6  ALBUMIN 3.7   Recent Labs  Lab 07/11/22 1747  LIPASE 30   No results for input(s): "AMMONIA" in the last 168 hours. Coagulation Profile: No results for input(s): "INR", "PROTIME" in the last 168 hours. Cardiac Enzymes: No results for input(s): "CKTOTAL", "CKMB", "CKMBINDEX", "TROPONINI" in the last 168 hours. BNP (last 3 results) No results for input(s): "PROBNP" in the last 8760 hours. HbA1C: No results for input(s): "HGBA1C" in the last 72 hours. CBG: No results for input(s): "GLUCAP" in the last 168 hours. Lipid Profile: No results for input(s): "CHOL", "HDL", "LDLCALC", "TRIG", "  CHOLHDL", "LDLDIRECT" in the last 72 hours. Thyroid Function Tests: No results for input(s): "TSH", "T4TOTAL", "FREET4", "T3FREE", "THYROIDAB" in the last 72 hours. Anemia Panel: No results for input(s): "VITAMINB12", "FOLATE", "FERRITIN", "TIBC", "IRON", "RETICCTPCT" in the last 72 hours. Sepsis Labs: No results for input(s): "PROCALCITON", "LATICACIDVEN" in the last 168 hours.  No results found for this or any previous visit (from the past 240 hour(s)).       Radiology Studies: DG Abd 2 Views  Result Date: 07/12/2022 CLINICAL DATA:  [id number] SBO (small bowel obstruction) ([place]) [id number]  EXAM: ABDOMEN - 2 VIEW COMPARISON:  CT abdomen/pelvis 07/11/2022. FINDINGS: Dilated small bowel loops, compatible with known obstruction. No obvious evidence of free air. Lung bases appear largely clear. Right upper quadrant clips. Polyarticular degenerative change. IMPRESSION: Findings compatible with known small-bowel obstruction. Electronically Signed   By: Margaretha Sheffield M.D.   On: 07/12/2022 10:00   CT ABDOMEN PELVIS W CONTRAST  Result Date: 07/11/2022 CLINICAL DATA:  Diarrhea, vomiting, excessive belching EXAM: CT ABDOMEN AND PELVIS WITH CONTRAST TECHNIQUE: Multidetector CT imaging of the abdomen and pelvis was performed using the standard protocol following bolus administration of intravenous contrast. RADIATION DOSE REDUCTION: This exam was performed according to the departmental dose-optimization program which includes automated exposure control, adjustment of the mA and/or kV according to patient size and/or use of iterative reconstruction technique. CONTRAST:  130m OMNIPAQUE IOHEXOL 300 MG/ML  SOLN COMPARISON:  01/03/2013 FINDINGS: Lower chest: Lung bases are clear. Hepatobiliary: Liver is within normal limits. Status post cholecystectomy. No intrahepatic or extrahepatic duct dilatation. Pancreas: Within normal limits. Spleen: Within normal limits Adrenals/Urinary Tract: Dual glands are within normal limits. Kidneys are within normal limits.  No hydronephrosis. Bladder is within normal limits. Stomach/Bowel: Stomach is within normal limits. Dilated loops of small bowel in the left mid abdomen, with decompressed loops in the right lower quadrant, suggesting small bowel obstruction. Mildly angulated loop in the right mid/lower abdomen (series 2/image 68), relative transition, suggesting adhesions. Appendix is not discretely visualized. Status post left hemicolectomy with suture line in the left lower pelvis (series 2/image 69). Mild left colonic diverticulosis, without evidence of diverticulitis.  Vascular/Lymphatic: No evidence of abdominal aortic aneurysm. No suspicious abdominopelvic lymphadenopathy. Reproductive: Status post hysterectomy. No adnexal masses. Other: No abdominopelvic ascites. Musculoskeletal: Visualized osseous structures are within normal limits. IMPRESSION: Small bowel obstruction, with suspected adhesions in the right mid/lower abdomen. Status post left hemicolectomy. Mild left colonic diverticulosis, without evidence of diverticulitis. Additional ancillary findings as above. Electronically Signed   By: SJulian HyM.D.   On: 07/11/2022 20:42        Scheduled Meds:  bisoprolol-hydrochlorothiazide  1 tablet Oral q morning   calcium-vitamin D   Oral Daily   DULoxetine  60 mg Oral Daily   enoxaparin (LOVENOX) injection  0.5 mg/kg Subcutaneous Q24H   furosemide  40 mg Oral Daily   irbesartan  75 mg Oral Daily   metaxalone  800 mg Oral BID   pantoprazole  40 mg Oral Daily   [START ON 07/13/2022] pneumococcal 20-valent conjugate vaccine  0.5 mL Intramuscular Tomorrow-1000   Continuous Infusions:  0.9 % NaCl with KCl 20 mEq / L 100 mL/hr at 07/12/22 1243     LOS: 1 day    Time spent: 35 mins    JWyvonnia Dusky MD [facility] Pager [phone]  If 7PM-7AM, please contact night-coverage [email or web address]  07/12/2022, 1:59 PM '

## 2022-07-12 NOTE — Progress Notes (Signed)
Pt came back from IR. Ng tube was placed.

## 2022-07-12 NOTE — Consult Note (Addendum)
SURGICAL CONSULTATION NOTE   HISTORY OF PRESENT ILLNESS (HPI):  71 y.o. female presented to [facility] ED for evaluation of nausea, vomiting and diarrhea. Patient reports she has been feeling nauseous with vomiting and diarrhea for several days.  She denies significant abdominal pain.  Except evaluation.  He has been no alleviating or aggravating factors.  Last bowel movement was yesterday and described as diarrhea.  In the ED she was found with distended abdomen.  Adequate vital signs.  Labs shows mild leukocytosis with normal hemoglobin.  There is no significant electrolyte disturbance.  She had a CT scan of the abdomen and pelvis that shows small bowel dilation concerning for small bowel obstruction.  No free air or free fluid.  I personally evaluated the images.  Surgery is consulted by Dr. Jimmye Norman in this context for evaluation and management of small bowel obstruction.  PAST MEDICAL HISTORY (PMH):  Past Medical History:  Diagnosis Date   Arthritis    Asthma    wheezing with URI- no inhalers day to day   Cervical disc herniation    Colon adenomas    Deep vein thrombosis (DVT) during current hospitalization ([place])    Easy bruising    GERD (gastroesophageal reflux disease)    Hypertension    Mitral valve prolapse    eccho 7/13 chart   Peripheral vascular disease ([place]) 7/12   DVT  left leg  post op- coumadin x 6 months   Sleep apnea      PAST SURGICAL HISTORY ([place]):  Past Surgical History:  Procedure Laterality Date   ABDOMINAL HYSTERECTOMY  2007   total   APPENDECTOMY     CERVICAL SPINE SURGERY     CHOLECYSTECTOMY     COLON SURGERY  06/30/2010   colectomy, Dr Pat Patrick   COLONOSCOPY N/A 04/28/2022   Procedure: COLONOSCOPY;  Surgeon: Toledo, Benay Pike, MD;  Location: [facility] ENDOSCOPY;  Service: Gastroenterology;  Laterality: N/A;   COLONOSCOPY WITH PROPOFOL N/A 02/13/2018   Procedure: COLONOSCOPY WITH PROPOFOL;  Surgeon: Lollie Sails, MD;  Location: [facility] ENDOSCOPY;  Service:  Endoscopy;  Laterality: N/A;   COLOSTOMY  2011   Dr Pat Patrick   COLOSTOMY TAKEDOWN  02/2011   Dr Haskell Riling SIGMOIDOSCOPY N/A 06/05/2018   Procedure: FLEXIBLE SIGMOIDOSCOPY;  Surgeon: Lollie Sails, MD;  Location: [facility] ENDOSCOPY;  Service: Endoscopy;  Laterality: N/A;   HERNIA REPAIR     INSERTION OF MESH N/A 02/26/2013   Procedure: INSERTION OF MESH;  Surgeon: Imogene Burn. Georgette Dover, MD;  Location: [facility] ORS;  Service: General;  Laterality: N/A;   LYSIS OF ADHESION N/A 02/26/2013   Procedure: LYSIS OF ADHESION;  Surgeon: Imogene Burn. Georgette Dover, MD;  Location: [facility] ORS;  Service: General;  Laterality: N/A;   SPINE SURGERY  2010   cervical neck fusion/  LIMITED MOBILITY   TONSILLECTOMY     TRANSANAL EXCISION OF RECTAL MASS N/A 11/10/2018   Procedure: TRANSANAL EXCISION OF RECTAL POLYP;  Surgeon: Robert Bellow, MD;  Location: [facility] ORS;  Service: General;  Laterality: N/A;   VENTRAL HERNIA REPAIR N/A 02/26/2013   Procedure: HERNIA REPAIR VENTRAL ADULT;  Surgeon: Imogene Burn. Georgette Dover, MD;  Location: [facility] ORS;  Service: General;  Laterality: N/A;     MEDICATIONS:  Prior to Admission medications   Medication Sig Start Date End Date Taking? Authorizing Provider  bisoprolol-hydrochlorothiazide [facility]) 2.5-6.25 MG per tablet Take 1 tablet by mouth every morning.  11/24/12  Yes [provider]  Calcium Carb-Cholecalciferol (CALCIUM 600/VITAMIN D3  PO) Take 1 tablet by mouth daily.   Yes [provider]  clindamycin (CLEOCIN T) 1 % external solution Apply 1 Application topically 2 (two) times daily as needed. 06/13/22  Yes [provider]  DULoxetine (CYMBALTA) 60 MG capsule Take 60 mg by mouth daily.  01/14/13  Yes [provider]  esomeprazole (NEXIUM) 40 MG capsule Take 40 mg by mouth daily.   Yes [provider]  furosemide (LASIX) 20 MG tablet Take 40 mg by mouth daily.    Yes [provider]  metaxalone (SKELAXIN) 800 MG tablet Take 800 mg by mouth 2 (two)  times daily.  01/15/13  Yes [provider]  OVER THE COUNTER MEDICATION Take 2 capsules by mouth 3 (three) times daily. Turmeric Rose Mary Leaf Dogwood Bark Supplement   Yes [provider]  OVER THE COUNTER MEDICATION 1-2 tablets daily. ARTHROZENE   Yes [provider]  telmisartan (MICARDIS) 40 MG tablet Take 40 mg by mouth daily.   Yes [provider]  acetaminophen (TYLENOL) 650 MG CR tablet Take 1,300 mg by mouth every 8 (eight) hours as needed for pain.    [provider]  OVER THE COUNTER MEDICATION Apply 1 application topically every 6 (six) hours as needed (for pain). Toprisin Topical Cream    [provider]     ALLERGIES:  Allergies  Allergen Reactions   Morphine And Related Other (See Comments)    Elephant on chest After IV push med delivered can tolerate slow delivery per pt 10/16/2018   Novocain [Procaine] Other (See Comments)    Blackout (as a child)   Aspirin Other (See Comments)    Ringing in the ears   Doxycycline Hyclate Diarrhea   Elemental Sulfur Other (See Comments)    sweating   Levaquin [Levofloxacin In D5w] Diarrhea   Penicillins Rash   Tape Other (See Comments)    USE PAPER TAPE     SOCIAL HISTORY:  Social History   Socioeconomic History   Marital status: Divorced    Spouse name: Not on file   Number of children: Not on file   Years of education: Not on file   Highest education level: Not on file  Occupational History   Not on file  Tobacco Use   Smoking status: Former    Packs/day: 1.00    Years: 10.00    Total pack years: 10.00    Types: Cigarettes    Quit date: 01/16/1985    Years since quitting: 37.5   Smokeless tobacco: Never  Vaping Use   Vaping Use: Never used  Substance and Sexual Activity   Alcohol use: Yes    Comment: occ   Drug use: No   Sexual activity: Not on file  Other Topics Concern   Not on file  Social History Narrative   Not on file   Social Determinants of  Health   Financial Resource Strain: Not on file  Food Insecurity: Not on file  Transportation Needs: Not on file  Physical Activity: Not on file  Stress: Not on file  Social Connections: Not on file  Intimate Partner Violence: Not on file      FAMILY HISTORY:  Family History  Problem Relation Age of Onset   Arthritis Mother    Hypertension Mother    Breast cancer Paternal Aunt 62   Colon cancer Maternal Grandfather 5   Colon cancer Cousin        maternal     REVIEW OF  SYSTEMS:  Constitutional: denies weight loss, fever, chills, or sweats  Eyes: denies any other vision changes, history of eye injury  ENT: denies sore throat, hearing problems  Respiratory: denies shortness of breath, wheezing  Cardiovascular: denies chest pain, palpitations  Gastrointestinal: Positive nausea and vomiting Genitourinary: denies burning with urination or urinary frequency Musculoskeletal: denies any other joint pains or cramps  Skin: denies any other rashes or skin discolorations  Neurological: denies any other headache, dizziness, weakness  Psychiatric: denies any other depression, anxiety   All other review of systems were negative   VITAL SIGNS:  Temp:  [98.1 F (36.7 C)-99 F (37.2 C)] 98.4 F (36.9 C) (11/06 0442) Pulse Rate:  [62-92] 62 (11/06 0600) Resp:  [12-18] 16 (11/06 0500) BP: (101-135)/(61-83) 135/72 (11/06 0600) SpO2:  [93 %-97 %] 95 % (11/06 0600) Weight:  [111.1 kg] 111.1 kg (11/05 1745)     Height: '5\' 4"'$  (162.6 cm) Weight: 111.1 kg BMI (Calculated): 42.03   INTAKE/OUTPUT:  This shift: Total I/O In: 326 [I.V.:326] Out: -   Last 2 shifts: '@IOLAST2SHIFTS'$ @   PHYSICAL EXAM:  Constitutional:  -- Obese -- Awake, alert, and oriented x3  Eyes:  -- Pupils equally round and reactive to light  -- No scleral icterus  Ear, nose, and throat:  -- No jugular venous distension  Pulmonary:  -- No crackles  -- Equal breath sounds bilaterally -- Breathing non-labored at  rest Cardiovascular:  -- S1, S2 present  -- No pericardial rubs Gastrointestinal:  -- Abdomen soft, nontender, distended, no guarding or rebound tenderness -- No abdominal masses appreciated, pulsatile or otherwise  Musculoskeletal and Integumentary:  -- Wounds: None appreciated -- Extremities: B/L UE and LE FROM, hands and feet warm, no edema  Neurologic:  -- Motor function: intact and symmetric -- Sensation: intact and symmetric   Labs:     Latest Ref Rng & Units 07/11/2022    5:47 PM 10/16/2018    8:59 AM 02/28/2013    4:02 AM  CBC  WBC 4.0 - 10.5 K/uL 12.6  8.1  9.0   Hemoglobin 12.0 - 15.0 g/dL 15.2  14.9  11.2   Hematocrit 36.0 - 46.0 % 44.8  45.1  34.7   Platelets 150 - 400 K/uL 301  231  187       Latest Ref Rng & Units 07/11/2022    5:47 PM 10/16/2018    8:59 AM 03/01/2013    4:24 AM  CMP  Glucose 70 - 99 mg/dL 134  119    BUN 8 - 23 mg/dL 12  15    Creatinine 0.44 - 1.00 mg/dL 0.96  0.86    Sodium 135 - 145 mmol/L 138  139    Potassium 3.5 - 5.1 mmol/L 3.6  3.7  2.9   Chloride 98 - 111 mmol/L 100  101    CO2 22 - 32 mmol/L 28  31    Calcium 8.9 - 10.3 mg/dL 9.6  9.9    Total Protein 6.5 - 8.1 g/dL 7.6     Total Bilirubin 0.3 - 1.2 mg/dL 1.4     Alkaline Phos 38 - 126 U/L 41     AST 15 - 41 U/L 47     ALT 0 - 44 U/L 62       Imaging studies:  EXAM: CT ABDOMEN AND PELVIS WITH CONTRAST   TECHNIQUE: Multidetector CT imaging of the abdomen and pelvis was performed using the standard protocol following bolus administration of intravenous contrast.  RADIATION DOSE REDUCTION: This exam was performed according to the departmental dose-optimization program which includes automated exposure control, adjustment of the mA and/or kV according to patient size and/or use of iterative reconstruction technique.   CONTRAST:  11m OMNIPAQUE IOHEXOL 300 MG/ML  SOLN   COMPARISON:  01/03/2013   FINDINGS: Lower chest: Lung bases are clear.   Hepatobiliary: Liver is  within normal limits.   Status post cholecystectomy. No intrahepatic or extrahepatic duct dilatation.   Pancreas: Within normal limits.   Spleen: Within normal limits   Adrenals/Urinary Tract: Dual glands are within normal limits.   Kidneys are within normal limits.  No hydronephrosis.   Bladder is within normal limits.   Stomach/Bowel: Stomach is within normal limits.   Dilated loops of small bowel in the left mid abdomen, with decompressed loops in the right lower quadrant, suggesting small bowel obstruction. Mildly angulated loop in the right mid/lower abdomen (series 2/image 68), relative transition, suggesting adhesions.   Appendix is not discretely visualized.   Status post left hemicolectomy with suture line in the left lower pelvis (series 2/image 69).   Mild left colonic diverticulosis, without evidence of diverticulitis.   Vascular/Lymphatic: No evidence of abdominal aortic aneurysm.   No suspicious abdominopelvic lymphadenopathy.   Reproductive: Status post hysterectomy.   No adnexal masses.   Other: No abdominopelvic ascites.   Musculoskeletal: Visualized osseous structures are within normal limits.   IMPRESSION: Small bowel obstruction, with suspected adhesions in the right mid/lower abdomen.   Status post left hemicolectomy. Mild left colonic diverticulosis, without evidence of diverticulitis.   Additional ancillary findings as above.     Electronically Signed   By: SJulian HyM.D.   On: 07/11/2022 20:42  Assessment/Plan:  71y.o. female with small bowel obstruction, complicated by pertinent comorbidities including obesity, asthma, GERD, hypertension with multiple abdominal surgeries including hernia repairs and colectomy.  Patient with history, clinical exam and images consisting of small bowel obstruction.  Two nurses attempted to put an NG as well as myself but it was not successful.  Since patient is some vomiting at this moment we  will hold NGT placement.  We will follow-up x-ray this morning.  -If x-ray shows persistent obstructive pattern I will recommend NGT placement by interventional radiology  -I discussed with patient implication of having bowel obstruction.  I discussed with patient that usually if she is able to get decompress most of the patient resolved with conservative management.  I discussed with patient that if obstruction does not resolve with conservative management we will have to discuss about surgical intervention.  -Continue adequate IV hydration, bowel rest.  Following abdominal x-ray.  If x-ray shows persistent obstructive pattern will recommend NGT placement by IR.  -Keep electrolyte within normal limits  -I encouraged the patient to ambulate  I will follow-up closely.   EArnold Long MD   This initial encounter was for 60-minute most of the time counseling the patient and coordinating plan of care.

## 2022-07-12 NOTE — Progress Notes (Signed)
End of shift note:  NG tube was placed by IR. NG tube is connected to low intermittent suction. IV K replacement was given.

## 2022-07-12 NOTE — Progress Notes (Signed)
Provider Notification: Eugenie Norrie MD   pt is back from IR with NG tube in placed. Can we order chest xray to verified placement before we connect NG tube to suctioning.

## 2022-07-12 NOTE — Progress Notes (Signed)
Patient ID: Darden Dates, female   DOB: 02-08-1951, 71 y.o.   MRN: [id number]  SURGERY FOLLOW UP NOTE  Patient was reevaluated this afternoon.  She endorses having mild nausea but no vomiting.  She continued feeling bloated.  She thinks that she passed a small gas.  No bowel movement.  No significant abdominal pain.  Vitals:   07/12/22 1122 07/12/22 1153  BP: 119/69 (!) 112/54  Pulse: 76 81  Resp: 18 18  Temp: 98.2 F (36.8 C) 98.1 F (36.7 C)  SpO2: 96% 98%   General:  alert, oriented x3 Abdomen, distended, tympanic, nontender to palpation.  A/P: Abdominal x-ray this morning shows persistent small bowel with obstructive pattern.  She continued to feel bloated.  There has been minimal gas.  No nausea.  I attempted to put the NG this morning in addition to 3 other nurses.  I will see if IR can put it under visualization.  If it is not possible I will try to give her Gastrografin orally.  No acute abdomen at this moment.  No indication for emergent surgical intervention at this moment.  I will continue to follow closely.  Herbert Pun, MD FACS  This follow-up encounter was for 30 minutes most of the time counseling the patient and coordinating plan of care.

## 2022-07-13 ENCOUNTER — Inpatient Hospital Stay: Payer: Medicare Other

## 2022-07-13 DIAGNOSIS — I10 Essential (primary) hypertension: Secondary | ICD-10-CM | POA: Diagnosis not present

## 2022-07-13 DIAGNOSIS — K56609 Unspecified intestinal obstruction, unspecified as to partial versus complete obstruction: Principal | ICD-10-CM

## 2022-07-13 LAB — BASIC METABOLIC PANEL
Anion gap: 4 — ABNORMAL LOW (ref 5–15)
BUN: 12 mg/dL (ref 8–23)
CO2: 28 mmol/L (ref 22–32)
Calcium: 8.5 mg/dL — ABNORMAL LOW (ref 8.9–10.3)
Chloride: 106 mmol/L (ref 98–111)
Creatinine, Ser: 0.93 mg/dL (ref 0.44–1.00)
GFR, Estimated: >60 mL/min (ref >60)
Glucose, Bld: 97 mg/dL (ref 70–99)
Potassium: 3.8 mmol/L (ref 3.5–5.1)
Sodium: 138 mmol/L (ref 135–145)

## 2022-07-13 LAB — CBC
HCT: 39.8 % (ref 36.0–46.0)
Hemoglobin: 13.3 g/dL (ref 12.0–15.0)
MCH: 28.9 pg (ref 26.0–34.0)
MCHC: 33.4 g/dL (ref 30.0–36.0)
MCV: 86.5 fL (ref 80.0–100.0)
Platelets: 230 10*3/uL (ref 150–400)
RBC: 4.6 MIL/uL (ref 3.87–5.11)
RDW: 13.7 % (ref 11.5–15.5)
WBC: 8.1 10*3/uL (ref 4.0–10.5)
nRBC: 0 % (ref 0.0–0.2)

## 2022-07-13 MED ORDER — PANTOPRAZOLE SODIUM 40 MG IV SOLR
40.0000 mg | INTRAVENOUS | Status: DC
  Administered 2022-07-13 – 2022-07-17 (×5): 40 mg via INTRAVENOUS
  Filled 2022-07-13 (×5): qty 10

## 2022-07-13 MED ORDER — DIATRIZOATE MEGLUMINE & SODIUM 66-10 % PO SOLN
90.0000 mL | Freq: Once | ORAL | Status: AC
  Administered 2022-07-13: 90 mL via NASOGASTRIC

## 2022-07-13 NOTE — TOC Initial Note (Signed)
Transition of Care [facility]) - Initial/Assessment Note    Patient Details  Name: Kelly Perry MRN: [id number] Date of Birth: 10-Jun-1951  Transition of [facility]) CM/SW Contact:    Candie Chroman, LCSW Phone Number: 07/13/2022, 10:38 AM  Clinical Narrative:  CSW met with patient. Daughter at bedside but she stepped out for assessment. CSW introduced role and explained that discharge planning would be discussed. PCP is Emily Filbert, MD. She drives herself to appointments. For long-term medications she gets prescriptions at [facility] on [address]. For something that she needs for short-term like cough medicine, etc she gets it from [facility] since it is closer to her home. No issues obtaining medications. She lives at home with her dog [place]. No home health or DME use prior to admission. SDOH flag for utility assistance. Patient reports last year having a gas bill that was around $500. She paid that off in installments. Gave contact information for DSS/LIEAP program in case she ever runs into issues again. No further concerns. CSW encouraged patient to contact CSW as needed. CSW will continue to follow patient for support and facilitate return home once stable. Daughter will transport her home at discharge.           Expected Discharge Plan: Home/Self Care Barriers to Discharge: Continued Medical Work up   Patient Goals and CMS Choice     Choice offered to / list presented to : NA  Expected Discharge Plan and Services Expected Discharge Plan: Home/Self Care     Post Acute Care Choice: NA Living arrangements for the past 2 months: Single Family Home                                      Prior Living Arrangements/Services Living arrangements for the past 2 months: Single Family Home Lives with:: Pets Patient language and need for interpreter reviewed:: Yes Do you feel safe going back to the place where you live?: Yes      Need for Family Participation in  Patient Care: Yes (Comment) Care giver support system in place?: Yes (comment)   Criminal Activity/Legal Involvement Pertinent to Current Situation/Hospitalization: No - Comment as needed  Activities of Daily Living Home Assistive Devices/Equipment: Eyeglasses ADL Screening (condition at time of admission) Patient's cognitive ability adequate to safely complete daily activities?: Yes Is the patient deaf or have difficulty hearing?: No Does the patient have difficulty seeing, even when wearing glasses/contacts?: No Does the patient have difficulty concentrating, remembering, or making decisions?: No Patient able to express need for assistance with ADLs?: Yes Does the patient have difficulty dressing or bathing?: No Independently performs ADLs?: Yes (appropriate for developmental age) Does the patient have difficulty walking or climbing stairs?: No Weakness of Legs: None Weakness of Arms/Hands: None  Permission Sought/Granted                  Emotional Assessment Appearance:: Appears stated age Attitude/Demeanor/Rapport: Engaged, Gracious Affect (typically observed): Accepting, Appropriate, Calm, Pleasant Orientation: : Oriented to Self, Oriented to Place, Oriented to  Time, Oriented to Situation Alcohol / Substance Use: Not Applicable Psych Involvement: No (comment)  Admission diagnosis:  SBO (small bowel obstruction) ([place]) [K56.609] Patient Active Problem List   Diagnosis Date Noted   SBO (small bowel obstruction) ([place]) 07/11/2022   Leukocytosis 07/11/2022   Rectal polyp 06/09/2018   Mitral valve prolapse 10/31/2017   Morbid obesity with  BMI of 40.0-44.9, adult ([place]) 08/03/2017   Tubular adenoma 08/16/2016   Essential hypertension 07/16/2015   GERD without esophagitis 07/16/2015   OSA on CPAP 07/16/2015   DDD (degenerative disc disease), lumbar 02/13/2014   Lumbar radiculitis 02/13/2014   PCP:  Rusty Aus, MD Pharmacy:   [id number] [id number]: [phone]: [phone]    Social Determinants of Health (SDOH) Interventions Utilities Interventions: Inpatient TOC, Other (Comment) (Gave contact for DSS/LIEAP program)  Readmission Risk Interventions     No data to display

## 2022-07-13 NOTE — Progress Notes (Signed)
Patient ID: Kelly Perry, female   DOB: [birth year], 71 y.o.   MRN: [id number]     [place] [facility] Day(s): 2.   Interval History: Patient seen and examined, no acute events or new complaints overnight. Patient reports feeling better today.  She endorses feeling that she is passing gas and had a bowel movement.  Denies any nausea or vomiting.  Patient was able to get the NGT placed by radiology yesterday.  Her abdomen feels better.  Still mildly bloated.  Vital signs in last 24 hours: [min-max] current  Temp:  [98 F (36.7 C)-98.7 F (37.1 C)] 98.7 F (37.1 C) (11/07 0737) Pulse Rate:  [74-90] 74 (11/07 0737) Resp:  [18-20] 18 (11/07 0737) BP: (112-125)/(51-70) 125/70 (11/07 0737) SpO2:  [96 %-98 %] 97 % (11/07 0737)     Height: '5\' 4"'$  (162.6 cm) Weight: 111.1 kg BMI (Calculated): 42.03   Physical Exam:  Constitutional: alert, cooperative and no distress  Respiratory: breathing non-labored at rest  Cardiovascular: regular rate and sinus rhythm  Gastrointestinal: soft, non-tender, and mild-distended  Labs:     Latest Ref Rng & Units 07/13/2022    6:42 AM 07/12/2022    6:49 AM 07/11/2022    5:47 PM  CBC  WBC 4.0 - 10.5 K/uL 8.1  9.9  12.6   Hemoglobin 12.0 - 15.0 g/dL 13.3  13.9  15.2   Hematocrit 36.0 - 46.0 % 39.8  40.4  44.8   Platelets 150 - 400 K/uL 230  238  301       Latest Ref Rng & Units 07/13/2022    6:42 AM 07/12/2022    6:49 AM 07/11/2022    5:47 PM  CMP  Glucose 70 - 99 mg/dL 97  115  134   BUN 8 - 23 mg/dL '12  12  12   '$ Creatinine 0.44 - 1.00 mg/dL 0.93  0.82  0.96   Sodium 135 - 145 mmol/L 138  137  138   Potassium 3.5 - 5.1 mmol/L 3.8  3.3  3.6   Chloride 98 - 111 mmol/L 106  100  100   CO2 22 - 32 mmol/L '28  27  28   '$ Calcium 8.9 - 10.3 mg/dL 8.5  9.0  9.6   Total Protein 6.5 - 8.1 g/dL   7.6   Total Bilirubin 0.3 - 1.2 mg/dL   1.4   Alkaline Phos 38 - 126 U/L   41   AST 15 - 41 U/L   47   ALT 0 - 44 U/L   62     Imaging studies: I  personally evaluated the images of the nasogastric tube placement.  I also evaluated the chest area that shows NGT in the stomach.   Assessment/Plan:  71 y.o. female with small bowel obstruction, complicated by pertinent comorbidities including obesity, asthma, GERD, hypertension with multiple abdominal surgeries including hernia repairs and colectomy.   -Feeling better after NGT was placed. -We will do Gastrografin challenge today for further evaluation -Continue current conservative management -Encourage the patient to ambulate -Labs without leukocytosis, no significant electrolyte disturbance. I will continue to follow closely.  Arnold Long, MD

## 2022-07-13 NOTE — Progress Notes (Signed)
PROGRESS NOTE   HPI was taken from Dr. Sidney Ace: Kelly Perry is a 71 y.o.Caucasian female with medical history significant for osteoarthritis, asthma, GERD, hypertension, mitral valve prolapse, peripheral vascular disease and left lower extremity DVT as well as OSA, with previous history of cholecystectomy, left hemicolectomy and colostomy and transection of rectal mass, who presented to the emergency room with acute onset of intractable nausea and vomiting with diarrhea with associated bloating and excessive foul-smelling belching. She admits to associated mid abdominal wall bandlike pain.  She denies any bilious vomitus or hematemesis.  No melena or bright red being per rectum.  No hematemesis or jaundice.  No fever or chills.  No dysuria, oliguria or hematuria or flank pain.  No cough or wheezing or hemoptysis.  No chest pain or palpitations.   ED Course: Upon presentation to the emergency room, temperature was 99 and vital signs were otherwise within normal.  Labs revealed borderline potassium of 3.6 and AST 47 and ALT 62 with total bili of 1.4.CBC showed leukocytosis of 12.6 with hemoconcentration. UA was negative.   Imaging:  Abdominal pelvic CT scan with contrast showed small bowel obstruction with suspected adhesions in the right mid/lower abdomen.  It showed her left hemicolectomy and mild left colonic diverticulosis without diverticulitis.   The patient was given 1 mg of IV Ativan and 4 mg of Zofran ODT. She will be admitted to the medical bed for further evaluation and management.   Kelly Perry  NOM:[id number] DOB: 1951/03/14 DOA: 07/11/2022 PCP: Rusty Aus, MD   Assessment & Plan:   Principal Problem:   SBO (small bowel obstruction) ([place]) Active Problems:   Essential hypertension   Leukocytosis   GERD without esophagitis   OSA on CPAP  Assessment and Plan:  SBO: secondary to adhesions from previous laparotomies. NPO. Continue w/ NG tube w/ low intermittent  suction. Gastrografin challenge today as per gen surg. Gen surg following and recs apprec   Hypokalemia: WNL today   Leukocytosis: resolved   HTN: holding bisoprolol-HCTZ, irbesartan, lasix until no longer NPO   GERD: continue on PPI   OSA: CPAP qhs    Morbid obesity: BMI 42.0. Complicates overall care & prognosis   Depression: severity unknown. Holding home dose of duloxetine until no longer NPO    DVT prophylaxis: lovenox  Code Status: full  Family Communication: discussed pt's care w/ pt's family at bedside and answered their questions  Disposition Plan: likely d/c back home   Level of care: Med-Surg  Status is: Inpatient Remains inpatient appropriate because: severity of illness    Consultants:  Gen surg   Procedures:   Antimicrobials:   Subjective: Pt is feeling better but no bowel movement yet  Objective: Vitals:   07/12/22 1153 07/12/22 1546 07/13/22 0608 07/13/22 0737  BP: (!) 112/54 (!) 117/51 (!) 120/54 125/70  Pulse: 81 90 80 74  Resp: '18 20 20 18  '$ Temp: 98.1 F (36.7 C) 98 F (36.7 C) 98 F (36.7 C) 98.7 F (37.1 C)  TempSrc:  Oral Oral Oral  SpO2: 98% 98% 98% 97%  Weight:      Height:        Intake/Output Summary (Last 24 hours) at 07/13/2022 0742 Last data filed at 07/13/2022 0700 Gross per 24 hour  Intake --  Output 1500 ml  Net -1500 ml   Filed Weights   07/11/22 1745  Weight: 111.1 kg    Examination:  General exam: Appears comfortable. Morbid obesity  Respiratory system:  clear breath sounds b/l  Cardiovascular system: S1/S2+. No rubs or gallops  Gastrointestinal system: Abd is soft, NT, obese & hypoactive bowel sounds  Central nervous system: alert and oriented. Moves all extremities  Psychiatry: Judgement and insight appears normal. Appropriate mood and affect     Data Reviewed: I have personally reviewed following labs and imaging studies  CBC: Recent Labs  Lab 07/11/22 1747 07/12/22 0649 07/13/22 0642  WBC 12.6*  9.9 8.1  HGB 15.2* 13.9 13.3  HCT 44.8 40.4 39.8  MCV 85.3 85.6 86.5  PLT 301 238 161   Basic Metabolic Panel: Recent Labs  Lab 07/11/22 1747 07/12/22 0649 07/13/22 0642  NA 138 137 138  K 3.6 3.3* 3.8  CL 100 100 106  CO2 '28 27 28  '$ GLUCOSE 134* 115* 97  BUN '12 12 12  '$ CREATININE 0.96 0.82 0.93  CALCIUM 9.6 9.0 8.5*   GFR: Estimated Creatinine Clearance: 67.7 mL/min (by C-G formula based on SCr of 0.93 mg/dL). Liver Function Tests: Recent Labs  Lab 07/11/22 1747  AST 47*  ALT 62*  ALKPHOS 41  BILITOT 1.4*  PROT 7.6  ALBUMIN 3.7   Recent Labs  Lab 07/11/22 1747  LIPASE 30   No results for input(s): "AMMONIA" in the last 168 hours. Coagulation Profile: No results for input(s): "INR", "PROTIME" in the last 168 hours. Cardiac Enzymes: No results for input(s): "CKTOTAL", "CKMB", "CKMBINDEX", "TROPONINI" in the last 168 hours. BNP (last 3 results) No results for input(s): "PROBNP" in the last 8760 hours. HbA1C: No results for input(s): "HGBA1C" in the last 72 hours. CBG: No results for input(s): "GLUCAP" in the last 168 hours. Lipid Profile: No results for input(s): "CHOL", "HDL", "LDLCALC", "TRIG", "CHOLHDL", "LDLDIRECT" in the last 72 hours. Thyroid Function Tests: No results for input(s): "TSH", "T4TOTAL", "FREET4", "T3FREE", "THYROIDAB" in the last 72 hours. Anemia Panel: No results for input(s): "VITAMINB12", "FOLATE", "FERRITIN", "TIBC", "IRON", "RETICCTPCT" in the last 72 hours. Sepsis Labs: No results for input(s): "PROCALCITON", "LATICACIDVEN" in the last 168 hours.  No results found for this or any previous visit (from the past 240 hour(s)).       Radiology Studies: DG Chest Port 1 View  Result Date: 07/12/2022 CLINICAL DATA:  NG tube placement EXAM: PORTABLE CHEST 1 VIEW COMPARISON:  Previous studies including the examination of abdomen done earlier today FINDINGS: There is interval placement of enteric tube with its tip in the stomach. Cardiac  size is in the upper limits of normal. Visualized lung fields are clear of any infiltrates or pulmonary edema. There is no pleural effusion. Apices of the lungs are not included in the image. IMPRESSION: Tip of enteric tube is seen in the stomach. Electronically Signed   By: Elmer Picker M.D.   On: 07/12/2022 16:56   IR Naso G Tube Plc W/FL W/Rad  Result Date: 07/12/2022 CLINICAL DATA:  Small Bowel Obstruction EXAM: FLUOROSCOPIC-ASSISTED NASOGASTRIC TUBE PLACEMENT ANESTHESIA/SEDATION: Local viscous lidocaine MEDICATIONS: none CONTRAST:  None PROCEDURE: A time out was performed and viscous lidocaine was applied to the right nares. Due to resistance, placement was abandoned and approach was turned to the left nares. A small amount of viscous lidocaine was provided and a 16g Sump tube was easily passed through the left nare. Imaging demonstrated the tip of the tube passing into the esophagus. Tube was then advanced into the gastric body, and gastric contents were visualized in the tube. An image was saved as documentation. The tube was secured at 58cm at the  nares. COMPLICATIONS: Not able to pass through right nares FINDINGS: NG tube side port and distal tip within the stomach Fluoroscopic dose; 2.2 mGy IMPRESSION: Successful nasogastric tube placement as described above. Read by: Alexandria Lodge, PA-C Electronically Signed   By: Michaelle Birks M.D.   On: 07/12/2022 16:11   DG Abd 2 Views  Result Date: 07/12/2022 CLINICAL DATA:  [id number] SBO (small bowel obstruction) ([place]) [id number] EXAM: ABDOMEN - 2 VIEW COMPARISON:  CT abdomen/pelvis 07/11/2022. FINDINGS: Dilated small bowel loops, compatible with known obstruction. No obvious evidence of free air. Lung bases appear largely clear. Right upper quadrant clips. Polyarticular degenerative change. IMPRESSION: Findings compatible with known small-bowel obstruction. Electronically Signed   By: Margaretha Sheffield M.D.   On: 07/12/2022 10:00   CT ABDOMEN PELVIS W  CONTRAST  Result Date: 07/11/2022 CLINICAL DATA:  Diarrhea, vomiting, excessive belching EXAM: CT ABDOMEN AND PELVIS WITH CONTRAST TECHNIQUE: Multidetector CT imaging of the abdomen and pelvis was performed using the standard protocol following bolus administration of intravenous contrast. RADIATION DOSE REDUCTION: This exam was performed according to the departmental dose-optimization program which includes automated exposure control, adjustment of the mA and/or kV according to patient size and/or use of iterative reconstruction technique. CONTRAST:  189m OMNIPAQUE IOHEXOL 300 MG/ML  SOLN COMPARISON:  01/03/2013 FINDINGS: Lower chest: Lung bases are clear. Hepatobiliary: Liver is within normal limits. Status post cholecystectomy. No intrahepatic or extrahepatic duct dilatation. Pancreas: Within normal limits. Spleen: Within normal limits Adrenals/Urinary Tract: Dual glands are within normal limits. Kidneys are within normal limits.  No hydronephrosis. Bladder is within normal limits. Stomach/Bowel: Stomach is within normal limits. Dilated loops of small bowel in the left mid abdomen, with decompressed loops in the right lower quadrant, suggesting small bowel obstruction. Mildly angulated loop in the right mid/lower abdomen (series 2/image 68), relative transition, suggesting adhesions. Appendix is not discretely visualized. Status post left hemicolectomy with suture line in the left lower pelvis (series 2/image 69). Mild left colonic diverticulosis, without evidence of diverticulitis. Vascular/Lymphatic: No evidence of abdominal aortic aneurysm. No suspicious abdominopelvic lymphadenopathy. Reproductive: Status post hysterectomy. No adnexal masses. Other: No abdominopelvic ascites. Musculoskeletal: Visualized osseous structures are within normal limits. IMPRESSION: Small bowel obstruction, with suspected adhesions in the right mid/lower abdomen. Status post left hemicolectomy. Mild left colonic diverticulosis,  without evidence of diverticulitis. Additional ancillary findings as above. Electronically Signed   By: SJulian HyM.D.   On: 07/11/2022 20:42        Scheduled Meds:  bisoprolol-hydrochlorothiazide  1 tablet Oral q morning   calcium-vitamin D   Oral Daily   DULoxetine  60 mg Oral Daily   enoxaparin (LOVENOX) injection  0.5 mg/kg Subcutaneous Q24H   furosemide  40 mg Oral Daily   irbesartan  75 mg Oral Daily   metaxalone  800 mg Oral BID   pantoprazole  40 mg Oral Daily   pneumococcal 20-valent conjugate vaccine  0.5 mL Intramuscular Tomorrow-1000   Continuous Infusions:  0.9 % NaCl with KCl 20 mEq / L 100 mL/hr at 07/12/22 2158     LOS: 2 days    Time spent: 36 mins    JWyvonnia Dusky MD [facility] Pager [phone]  If 7PM-7AM, please contact night-coverage [email or web address]  07/13/2022, 7:42 AM '

## 2022-07-14 ENCOUNTER — Inpatient Hospital Stay: Payer: Medicare Other

## 2022-07-14 DIAGNOSIS — K219 Gastro-esophageal reflux disease without esophagitis: Secondary | ICD-10-CM

## 2022-07-14 DIAGNOSIS — D72829 Elevated white blood cell count, unspecified: Secondary | ICD-10-CM | POA: Diagnosis not present

## 2022-07-14 DIAGNOSIS — K56609 Unspecified intestinal obstruction, unspecified as to partial versus complete obstruction: Secondary | ICD-10-CM | POA: Diagnosis not present

## 2022-07-14 DIAGNOSIS — G4733 Obstructive sleep apnea (adult) (pediatric): Secondary | ICD-10-CM

## 2022-07-14 DIAGNOSIS — I10 Essential (primary) hypertension: Secondary | ICD-10-CM | POA: Diagnosis not present

## 2022-07-14 DIAGNOSIS — E876 Hypokalemia: Secondary | ICD-10-CM | POA: Insufficient documentation

## 2022-07-14 LAB — CBC
HCT: 39.4 % (ref 36.0–46.0)
Hemoglobin: 13.2 g/dL (ref 12.0–15.0)
MCH: 29.2 pg (ref 26.0–34.0)
MCHC: 33.5 g/dL (ref 30.0–36.0)
MCV: 87.2 fL (ref 80.0–100.0)
Platelets: 214 10*3/uL (ref 150–400)
RBC: 4.52 MIL/uL (ref 3.87–5.11)
RDW: 13.5 % (ref 11.5–15.5)
WBC: 9.7 10*3/uL (ref 4.0–10.5)
nRBC: 0 % (ref 0.0–0.2)

## 2022-07-14 LAB — BASIC METABOLIC PANEL
Anion gap: 7 (ref 5–15)
BUN: 10 mg/dL (ref 8–23)
CO2: 28 mmol/L (ref 22–32)
Calcium: 8.5 mg/dL — ABNORMAL LOW (ref 8.9–10.3)
Chloride: 105 mmol/L (ref 98–111)
Creatinine, Ser: 0.83 mg/dL (ref 0.44–1.00)
GFR, Estimated: >60 mL/min (ref >60)
Glucose, Bld: 97 mg/dL (ref 70–99)
Potassium: 4.3 mmol/L (ref 3.5–5.1)
Sodium: 140 mmol/L (ref 135–145)

## 2022-07-14 MED ORDER — GUAIFENESIN-DM 100-10 MG/5ML PO SYRP
5.0000 mL | ORAL_SOLUTION | ORAL | Status: DC | PRN
  Administered 2022-07-14: 5 mL via ORAL
  Filled 2022-07-14: qty 10

## 2022-07-14 MED ORDER — PHENOL 1.4 % MT LIQD
1.0000 | OROMUCOSAL | Status: DC | PRN
  Administered 2022-07-14: 1 via OROMUCOSAL
  Filled 2022-07-14: qty 177

## 2022-07-14 NOTE — Care Management Important Message (Signed)
Important Message  Patient Details  Name: Kelly Perry MRN: [id number] Date of Birth: 13-Dec-1950   Medicare Important Message Given:  Yes     [name] 07/14/2022, 11:37 AM

## 2022-07-14 NOTE — Assessment & Plan Note (Addendum)
Improved.  We will get rid of IV fluids since now advanced to the solid food.

## 2022-07-14 NOTE — Progress Notes (Signed)
  Progress Note   Patient: Kelly Perry OJJ:[id number] DOB: 10-17-1950 DOA: 07/11/2022     3 DOS: the patient was seen and examined on 07/14/2022     Assessment and Plan: * SBO (small bowel obstruction) [facility]) Appreciate surgical consultation.  Patient has NG tube in.  Started on clear liquid diet.  Had a bowel movement today.  Leukocytosis Resolved  Essential hypertension Continue Avapro, Lasix, bisoprolol hydrochlorothiazide  GERD without esophagitis - She will be on PPI therapy.  OSA on CPAP - We will resume CPAP nightly.  Hypokalemia Supplementing in IV fluid        Subjective: Patient feeling better than when she came in.  General surgery started on clear liquid diet.  She stated she had a bowel movement.  Feeling better.  No pain.  Admitted with small bowel obstruction  Physical Exam: Vitals:   07/14/22 0607 07/14/22 0610 07/14/22 0749 07/14/22 1449  BP: (!) 127/44  122/60 (!) 114/47  Pulse: 67 76 87 73  Resp: '16  18 18  '$ Temp: 98.3 F (36.8 C)  97.6 F (36.4 C) 98.2 F (36.8 C)  TempSrc: Oral  Oral Oral  SpO2:  97% 97% 97%  Weight:      Height:       Physical Exam HENT:     Head: Normocephalic.     Mouth/Throat:     Pharynx: No oropharyngeal exudate.  Eyes:     General: Lids are normal.     Conjunctiva/sclera: Conjunctivae normal.  Cardiovascular:     Rate and Rhythm: Normal rate and regular rhythm.     Heart sounds: Normal heart sounds, S1 normal and S2 normal.  Pulmonary:     Breath sounds: No decreased breath sounds, wheezing, rhonchi or rales.  Abdominal:     Palpations: Abdomen is soft.     Tenderness: There is no abdominal tenderness.  Musculoskeletal:     Right lower leg: No swelling.     Left lower leg: No swelling.  Skin:    General: Skin is warm.     Findings: No rash.  Neurological:     Mental Status: She is alert and oriented to person, place, and time.     Data Reviewed: Abdominal x-ray from today shows persistent  dilated air-fluid loops of small bowel with scattered air-fluid levels.  Persistent partial small bowel obstruction bowel gas pattern.  Potassium 4.3, creatinine 0.83, hemoglobin 13.2  Disposition: Status is: Inpatient Remains inpatient appropriate because: Being treated for small bowel obstruction. Planned Discharge Destination: Home    Time spent: 28 minutes  Author: Loletha Grayer, MD 07/14/2022 3:28 PM  For on call review [email or web address].

## 2022-07-14 NOTE — Progress Notes (Addendum)
Patient ID: Kelly Perry, female   DOB: 01/10/1951, 71 y.o.   MRN: [id number]     [place] Hospital Day(s): 3.   Interval History: Patient seen and examined, no acute events or new complaints overnight. Patient reports feeling better this morning. No nausea or vomiting. Still passing small gas. No new bowel movement over night. Less distended.   Vital signs in last 24 hours: [min-max] current  Temp:  [97.6 F (36.4 C)-98.8 F (37.1 C)] 97.6 F (36.4 C) (11/08 0749) Pulse Rate:  [67-94] 87 (11/08 0749) Resp:  [16-18] 18 (11/08 0749) BP: (122-127)/(44-62) 122/60 (11/08 0749) SpO2:  [97 %] 97 % (11/08 0749)     Height: '5\' 4"'$  (162.6 cm) Weight: 111.1 kg BMI (Calculated): 42.03   Physical Exam:  Constitutional: alert, cooperative and no distress  Respiratory: breathing non-labored at rest  Cardiovascular: regular rate and sinus rhythm  Gastrointestinal: soft, non-tender, and mild-distended  Labs:     Latest Ref Rng & Units 07/14/2022    6:17 AM 07/13/2022    6:42 AM 07/12/2022    6:49 AM  CBC  WBC 4.0 - 10.5 K/uL 9.7  8.1  9.9   Hemoglobin 12.0 - 15.0 g/dL 13.2  13.3  13.9   Hematocrit 36.0 - 46.0 % 39.4  39.8  40.4   Platelets 150 - 400 K/uL 214  230  238       Latest Ref Rng & Units 07/14/2022    6:17 AM 07/13/2022    6:42 AM 07/12/2022    6:49 AM  CMP  Glucose 70 - 99 mg/dL 97  97  115   BUN 8 - 23 mg/dL '10  12  12   '$ Creatinine 0.44 - 1.00 mg/dL 0.83  0.93  0.82   Sodium 135 - 145 mmol/L 140  138  137   Potassium 3.5 - 5.1 mmol/L 4.3  3.8  3.3   Chloride 98 - 111 mmol/L 105  106  100   CO2 22 - 32 mmol/L '28  28  27   '$ Calcium 8.9 - 10.3 mg/dL 8.5  8.5  9.0     Imaging studies: Abdominal xray shows gastrografin in large intestine.    Assessment/Plan:  71 y.o. female with small bowel obstruction, complicated by pertinent comorbidities including obesity, asthma, GERD, hypertension with multiple abdominal surgeries including hernia repairs and colectomy.     -Gastrografin reached the large intestine. This is good sign that obstruction should resolve without surgery -Started on clear liquid diet trial. Will assess for toleration. -Electrolytes continue within normal limits -No leukocytosis -Encourage the patient to ambulate Continue conservative management.   Arnold Long, MD   This encounter was for 50 minutes most of the time evaluation of images, evaluation of labs, evaluation of the patient with physical exam, counseling the patient and coordinating plan of care.

## 2022-07-14 NOTE — Progress Notes (Signed)
Patient ID: Darden Dates, female   DOB: 11-15-1950, 71 y.o.   MRN: [id number]  SURGERY FOLLOW UP NOTE  Patient was reevaluated this afternoon.  She endorses feeling better.  She had a medium-sized bowel movement.  She still passing a little bit of gas.  No significant abdominal pain.  Vitals:   07/14/22 1449 07/14/22 1659  BP: (!) 114/47 (!) 133/54  Pulse: 73 82  Resp: 18 18  Temp: 98.2 F (36.8 C) 97.9 F (36.6 C)  SpO2: 97% 97%   General: Alert, oriented x3 Abdomen: Soft and depressible, mildly distended, mildly tympanic  Assessment and plan:  Clinically patient without any clinical deterioration.  She had another bowel movement.  New abdominal x-ray this morning shows persistent obstructive gas pattern.  No significant improvement radiologically.  Since she is passing gas and having bowel movement I will not put her back to n.p.o. but I will continue following clinically.  I will not remove the NGT tonight in case she develop any nausea or vomiting.  I will continue to follow closely.  Herbert Pun, MD, FACS  This follow-up encounter was for 35-minute most of the time evaluating the patient, evaluation of images, counseling the patient and coordinating plan of care.

## 2022-07-14 NOTE — Progress Notes (Signed)
Patient continues to refuse secondary to NG tube, unit remains at bedside.

## 2022-07-15 DIAGNOSIS — I10 Essential (primary) hypertension: Secondary | ICD-10-CM | POA: Diagnosis not present

## 2022-07-15 DIAGNOSIS — K56609 Unspecified intestinal obstruction, unspecified as to partial versus complete obstruction: Secondary | ICD-10-CM | POA: Diagnosis not present

## 2022-07-15 DIAGNOSIS — K219 Gastro-esophageal reflux disease without esophagitis: Secondary | ICD-10-CM | POA: Diagnosis not present

## 2022-07-15 DIAGNOSIS — D72829 Elevated white blood cell count, unspecified: Secondary | ICD-10-CM | POA: Diagnosis not present

## 2022-07-15 LAB — BASIC METABOLIC PANEL
Anion gap: 7 (ref 5–15)
BUN: 6 mg/dL — ABNORMAL LOW (ref 8–23)
CO2: 26 mmol/L (ref 22–32)
Calcium: 8.5 mg/dL — ABNORMAL LOW (ref 8.9–10.3)
Chloride: 105 mmol/L (ref 98–111)
Creatinine, Ser: 0.78 mg/dL (ref 0.44–1.00)
GFR, Estimated: >60 mL/min (ref >60)
Glucose, Bld: 115 mg/dL — ABNORMAL HIGH (ref 70–99)
Potassium: 3.3 mmol/L — ABNORMAL LOW (ref 3.5–5.1)
Sodium: 138 mmol/L (ref 135–145)

## 2022-07-15 LAB — MAGNESIUM: Magnesium: 2 mg/dL (ref 1.7–2.4)

## 2022-07-15 MED ORDER — BISOPROLOL FUMARATE 5 MG PO TABS
2.5000 mg | ORAL_TABLET | Freq: Every day | ORAL | Status: DC
  Filled 2022-07-15: qty 0.5

## 2022-07-15 MED ORDER — POLYETHYLENE GLYCOL 3350 17 G PO PACK
17.0000 g | PACK | Freq: Two times a day (BID) | ORAL | Status: DC
  Administered 2022-07-15 (×2): 17 g via ORAL
  Filled 2022-07-15 (×3): qty 1

## 2022-07-15 MED ORDER — POTASSIUM CHLORIDE CRYS ER 20 MEQ PO TBCR
40.0000 meq | EXTENDED_RELEASE_TABLET | Freq: Once | ORAL | Status: AC
  Administered 2022-07-15: 40 meq via ORAL
  Filled 2022-07-15: qty 2

## 2022-07-15 MED ORDER — BISOPROLOL FUMARATE 5 MG PO TABS
2.5000 mg | ORAL_TABLET | Freq: Every day | ORAL | Status: DC
  Administered 2022-07-15 – 2022-07-17 (×3): 2.5 mg via ORAL
  Filled 2022-07-15 (×3): qty 0.5

## 2022-07-15 NOTE — Progress Notes (Signed)
Patient ID: Kelly Perry, female   DOB: [birth year], 71 y.o.   MRN: [id number]     [place] Hospital Day(s): 4.   Interval History: Patient seen and examined, no acute events or new complaints overnight. Patient reports feeling better. She endorses continue passing gas. Has the urge to have a bowel movement. No nausea despite having the NGT clamped for 48 hrs.   Vital signs in last 24 hours: [min-max] current  Temp:  [97.6 F (36.4 C)-98.2 F (36.8 C)] 97.9 F (36.6 C) (11/09 0728) Pulse Rate:  [73-87] 74 (11/09 0728) Resp:  [16-18] 17 (11/09 0728) BP: (114-133)/(47-65) 114/53 (11/09 0728) SpO2:  [97 %-100 %] 97 % (11/09 0728)     Height: '5\' 4"'$  (162.6 cm) Weight: 111.1 kg BMI (Calculated): 42.03   Physical Exam:  Constitutional: alert, cooperative and no distress  Respiratory: breathing non-labored at rest  Cardiovascular: regular rate and sinus rhythm  Gastrointestinal: soft, non-tender, and mild-distended  Labs:     Latest Ref Rng & Units 07/14/2022    6:17 AM 07/13/2022    6:42 AM 07/12/2022    6:49 AM  CBC  WBC 4.0 - 10.5 K/uL 9.7  8.1  9.9   Hemoglobin 12.0 - 15.0 g/dL 13.2  13.3  13.9   Hematocrit 36.0 - 46.0 % 39.4  39.8  40.4   Platelets 150 - 400 K/uL 214  230  238       Latest Ref Rng & Units 07/15/2022    4:26 AM 07/14/2022    6:17 AM 07/13/2022    6:42 AM  CMP  Glucose 70 - 99 mg/dL 115  97  97   BUN 8 - 23 mg/dL '6  10  12   '$ Creatinine 0.44 - 1.00 mg/dL 0.78  0.83  0.93   Sodium 135 - 145 mmol/L 138  140  138   Potassium 3.5 - 5.1 mmol/L 3.3  4.3  3.8   Chloride 98 - 111 mmol/L 105  105  106   CO2 22 - 32 mmol/L '26  28  28   '$ Calcium 8.9 - 10.3 mg/dL 8.5  8.5  8.5     Imaging studies: No new pertinent imaging studies   Assessment/Plan:  71 y.o. female with small bowel obstruction, complicated by pertinent comorbidities including obesity, asthma, GERD, hypertension with multiple abdominal surgeries including hernia repairs and colectomy.      -Gastrografin reached the large intestine. This is good sign that obstruction should resolve without surgery -Continue passing gas. -Will advance diet to full liquids -Will assess diet toleration before removing NGT since it was needed to be placed by IR.  -Encourage the patient to ambulate   Arnold Long, MD

## 2022-07-15 NOTE — Progress Notes (Signed)
  Progress Note   Patient: Kelly Perry ASN:[id number] DOB: December 07, 1950 DOA: 07/11/2022     4 DOS: the patient was seen and examined on 07/15/2022    Assessment and Plan: * SBO (small bowel obstruction) [facility]) Appreciate surgical consultation.  Patient has NG tube in as of this morning.  General surgery wants to make sure prior to taking out the NG tube that it does not have to go back in.  Advance to full liquid diet today.  Leukocytosis Resolved  Essential hypertension Continue Avapro, Lasix, bisoprolol.  Hold hydrochlorothiazide with hypokalemia.  GERD without esophagitis - She will be on PPI therapy.  OSA on CPAP - We will resume CPAP nightly.  Hypokalemia Supplementing in IV fluid and will also give oral supplementation today.        Subjective: No nausea or vomiting.  Some abdominal soreness but better than when she came in.  Having bowel movements.  Advance to full liquid diet.  Admitted with small bowel obstruction.  Physical Exam: Vitals:   07/14/22 2045 07/15/22 0339 07/15/22 0728 07/15/22 1414  BP: (!) 130/58 116/65 (!) 114/53 101/63  Pulse: 76 82 74 77  Resp: '16 18 17 18  '$ Temp: 98.2 F (36.8 C) 98.2 F (36.8 C) 97.9 F (36.6 C) 98.9 F (37.2 C)  TempSrc: Oral Oral Oral Oral  SpO2: 98% 100% 97% 94%  Weight:      Height:       Physical Exam HENT:     Head: Normocephalic.     Mouth/Throat:     Pharynx: No oropharyngeal exudate.  Eyes:     General: Lids are normal.     Conjunctiva/sclera: Conjunctivae normal.  Cardiovascular:     Rate and Rhythm: Normal rate and regular rhythm.     Heart sounds: Normal heart sounds, S1 normal and S2 normal.  Pulmonary:     Breath sounds: No decreased breath sounds, wheezing, rhonchi or rales.  Abdominal:     Palpations: Abdomen is soft.     Tenderness: There is generalized abdominal tenderness.  Musculoskeletal:     Right lower leg: No swelling.     Left lower leg: No swelling.  Skin:    General: Skin is  warm.     Findings: No rash.  Neurological:     Mental Status: She is alert and oriented to person, place, and time.     Data Reviewed: Potassium 3.3  Disposition: Status is: Inpatient Remains inpatient appropriate because: Advancing diet slowly, treating for small bowel obstruction.  Planned Discharge Destination: Home    Time spent: 28 minutes  Author: Loletha Grayer, MD 07/15/2022 3:05 PM  For on call review [email or web address].

## 2022-07-16 DIAGNOSIS — D72829 Elevated white blood cell count, unspecified: Secondary | ICD-10-CM

## 2022-07-16 DIAGNOSIS — K56609 Unspecified intestinal obstruction, unspecified as to partial versus complete obstruction: Principal | ICD-10-CM

## 2022-07-16 DIAGNOSIS — E876 Hypokalemia: Secondary | ICD-10-CM

## 2022-07-16 DIAGNOSIS — I10 Essential (primary) hypertension: Secondary | ICD-10-CM

## 2022-07-16 DIAGNOSIS — G4733 Obstructive sleep apnea (adult) (pediatric): Secondary | ICD-10-CM

## 2022-07-16 DIAGNOSIS — K219 Gastro-esophageal reflux disease without esophagitis: Secondary | ICD-10-CM

## 2022-07-16 LAB — BASIC METABOLIC PANEL
Anion gap: 8 (ref 5–15)
BUN: <5 mg/dL — ABNORMAL LOW (ref 8–23)
CO2: 27 mmol/L (ref 22–32)
Calcium: 9.3 mg/dL (ref 8.9–10.3)
Chloride: 106 mmol/L (ref 98–111)
Creatinine, Ser: 0.8 mg/dL (ref 0.44–1.00)
GFR, Estimated: >60 mL/min (ref >60)
Glucose, Bld: 140 mg/dL — ABNORMAL HIGH (ref 70–99)
Potassium: 3.6 mmol/L (ref 3.5–5.1)
Sodium: 141 mmol/L (ref 135–145)

## 2022-07-16 LAB — MAGNESIUM: Magnesium: 2 mg/dL (ref 1.7–2.4)

## 2022-07-16 NOTE — Progress Notes (Addendum)
Patient is alert and oriented x4. Had episode of nausea and had to have zofran. Ambulated around unit a few times. Denied pain. Is having flatulence but reports that she did not have bowel movement.

## 2022-07-16 NOTE — Progress Notes (Signed)
  Progress Note   Patient: Kelly Perry YIR:[id number] DOB: [birth year] DOA: 07/11/2022     5 DOS: the patient was seen and examined on 07/16/2022    Assessment and Plan: * SBO (small bowel obstruction) [facility]) Appreciate surgical consultation.  Patient had NG tube removed this morning.  General surgery advance diet to soft diet.  If tolerates eating today may be able to get out of the hospital tomorrow.  Leukocytosis Resolved  Essential hypertension Continue Avapro, Lasix, bisoprolol.  Hold hydrochlorothiazide with hypokalemia.  GERD without esophagitis on PPI therapy.  OSA on CPAP CPAP nightly.  Hypokalemia Improved.  We will get rid of IV fluids since now advanced to the solid food.        Subjective: Patient did have some nausea with MiraLAX.  No bowel movement since admission.  No abdominal pain.  Admitted with small bowel obstruction.  Physical Exam: Vitals:   07/15/22 1414 07/15/22 1945 07/16/22 0317 07/16/22 0756  BP: 101/63 122/62 (!) 116/58 126/70  Pulse: 77 81 72 80  Resp: '18 18 18 18  '$ Temp: 98.9 F (37.2 C) 98.5 F (36.9 C) 98.1 F (36.7 C) 98.9 F (37.2 C)  TempSrc: Oral Oral Oral Oral  SpO2: 94% 95% 100% 96%  Weight:      Height:       Physical Exam HENT:     Head: Normocephalic.     Mouth/Throat:     Pharynx: No oropharyngeal exudate.  Eyes:     General: Lids are normal.     Conjunctiva/sclera: Conjunctivae normal.  Cardiovascular:     Rate and Rhythm: Normal rate and regular rhythm.     Heart sounds: Normal heart sounds, S1 normal and S2 normal.  Pulmonary:     Breath sounds: No decreased breath sounds, wheezing, rhonchi or rales.  Abdominal:     Palpations: Abdomen is soft.     Tenderness: There is no abdominal tenderness.  Musculoskeletal:     Right lower leg: No swelling.     Left lower leg: No swelling.  Skin:    General: Skin is warm.     Findings: No rash.  Neurological:     Mental Status: She is alert and oriented to  person, place, and time.     Data Reviewed: Potassium 3.6, creatinine 0.8  Disposition: Status is: Inpatient Remains inpatient appropriate because: Advance to solid food today.  If general surgery clears may be able to go home tomorrow.  Planned Discharge Destination: Home    Time spent: 27 minutes  Author: Loletha Grayer, MD 07/16/2022 3:19 PM  For on call review [email or web address].

## 2022-07-16 NOTE — Plan of Care (Signed)

## 2022-07-16 NOTE — Progress Notes (Signed)
Patient ID: Kelly Perry, female   DOB: 04-21-1951, 71 y.o.   MRN: [id number]     [place] Hospital Day(s): 5.   Interval History: Patient seen and examined, no acute events or new complaints overnight. Patient reports had one episode of nausea last night after taking the Miralax. The nausea went away. This morning without nausea. Does not recall if she passed gas overnight. No vomiting.   After waking up she continue feeling better and she new she endorses that she is passing gas.   Vital signs in last 24 hours: [min-max] current  Temp:  [98.1 F (36.7 C)-98.9 F (37.2 C)] 98.1 F (36.7 C) (11/10 0317) Pulse Rate:  [72-81] 72 (11/10 0317) Resp:  [18] 18 (11/10 0317) BP: (101-122)/(58-63) 116/58 (11/10 0317) SpO2:  [94 %-100 %] 100 % (11/10 0317)     Height: '5\' 4"'$  (162.6 cm) Weight: 111.1 kg BMI (Calculated): 42.03   Physical Exam:  Constitutional: alert, cooperative and no distress  Respiratory: breathing non-labored at rest  Cardiovascular: regular rate and sinus rhythm  Gastrointestinal: soft, non-tender, and non-distended  Labs:     Latest Ref Rng & Units 07/14/2022    6:17 AM 07/13/2022    6:42 AM 07/12/2022    6:49 AM  CBC  WBC 4.0 - 10.5 K/uL 9.7  8.1  9.9   Hemoglobin 12.0 - 15.0 g/dL 13.2  13.3  13.9   Hematocrit 36.0 - 46.0 % 39.4  39.8  40.4   Platelets 150 - 400 K/uL 214  230  238       Latest Ref Rng & Units 07/16/2022    3:49 AM 07/15/2022    4:26 AM 07/14/2022    6:17 AM  CMP  Glucose 70 - 99 mg/dL 140  115  97   BUN 8 - 23 mg/dL '5  6  10   '$ Creatinine 0.44 - 1.00 mg/dL 0.80  0.78  0.83   Sodium 135 - 145 mmol/L 141  138  140   Potassium 3.5 - 5.1 mmol/L 3.6  3.3  4.3   Chloride 98 - 111 mmol/L 106  105  105   CO2 22 - 32 mmol/L '27  26  28   '$ Calcium 8.9 - 10.3 mg/dL 9.3  8.5  8.5     Imaging studies: No new pertinent imaging studies   Assessment/Plan:  71 y.o. female with small bowel obstruction, complicated by pertinent  comorbidities including obesity, asthma, GERD, hypertension with multiple abdominal surgeries including hernia repairs and colectomy.     -Had one episode of nausea after having MiraLAX but this resolved. -This morning she is feeling well.  After waking up she confirmed that she is passing gas and she is feeling very comfortable.  She wants to try to remove the NGT and try to advance her diet.  She feels good appetite. -I removed the NGT and we will advance diet to soft diet -Discontinue MiraLAX as a possible cause of nausea -I encouraged the patient to ambulate -I will continue to follow closely.   Arnold Long, MD

## 2022-07-16 NOTE — Care Management Important Message (Signed)
Important Message  Patient Details  Name: Kelly Perry MRN: [id number] Date of Birth: 07/25/1951   Medicare Important Message Given:  Yes     [name] 07/16/2022, 10:55 AM

## 2022-07-16 NOTE — Progress Notes (Signed)
Mobility Specialist - Progress Note   07/16/22 1543  Mobility  Activity Ambulated independently in hallway  Level of [place] None  Distance Ambulated (ft) 800 ft  Activity Response Tolerated well  Mobility Referral Yes  $Mobility charge 1 Mobility   Candie Mile Mobility Specialist 07/16/22 3:44 PM

## 2022-07-16 NOTE — Progress Notes (Signed)
Mobility Specialist - Progress Note   07/16/22 1344  Mobility  Activity Ambulated independently in hallway;Ambulated independently in room  Level of Assistance Independent  Assistive Device None  Distance Ambulated (ft) 640 ft  Activity Response Tolerated well  Mobility Referral Yes  $Mobility charge 1 Mobility   Pt ambulating in room upon entry, utilizing RA. Pt ambulated 4 laps around NS without AD, tolerated well. Pt returned to room, left sitting EOB with needs within reach.   Candie Mile Mobility Specialist 07/16/22 1:47 PM

## 2022-07-17 LAB — BASIC METABOLIC PANEL
Anion gap: 7 (ref 5–15)
BUN: 6 mg/dL — ABNORMAL LOW (ref 8–23)
CO2: 30 mmol/L (ref 22–32)
Calcium: 9.6 mg/dL (ref 8.9–10.3)
Chloride: 100 mmol/L (ref 98–111)
Creatinine, Ser: 0.85 mg/dL (ref 0.44–1.00)
GFR, Estimated: >60 mL/min (ref >60)
Glucose, Bld: 119 mg/dL — ABNORMAL HIGH (ref 70–99)
Potassium: 3.6 mmol/L (ref 3.5–5.1)
Sodium: 137 mmol/L (ref 135–145)

## 2022-07-17 LAB — HEMOGLOBIN: Hemoglobin: 13 g/dL (ref 12.0–15.0)

## 2022-07-17 MED ORDER — BISOPROLOL FUMARATE 5 MG PO TABS: 2.5000 mg | ORAL_TABLET | Freq: Every day | ORAL | 0 refills | Status: AC

## 2022-07-17 MED ORDER — ACETAMINOPHEN ER 650 MG PO TBCR: 650.0000 mg | EXTENDED_RELEASE_TABLET | Freq: Three times a day (TID) | ORAL | Status: AC | PRN

## 2022-07-17 NOTE — Discharge Summary (Signed)
Physician Discharge Summary   Patient: Kelly Perry MRN: [id number] DOB: [birth year]  Admit date:     07/11/2022  Discharge date: 07/17/22  Discharge Physician: Loletha Grayer   PCP: Rusty Aus, MD   Recommendations at discharge:   Follow-up PCP 5  Discharge Diagnoses: Principal Problem:   SBO (small bowel obstruction) (HCC) Active Problems:   Essential hypertension   GERD without esophagitis   Leukocytosis   OSA on CPAP   Hypokalemia   Hospital Course: The patient was admitted to the hospital on 07/11/2022 and discharged on 07/17/2022.  Vomiting.  She was found to have a small bowel obstruction.  Dr. Windell Moment consulted.  We did conservative management with NG tube and IV fluid hydration.  Patient improved during the hospital course.  Diet was advanced slowly.  Patient cleared by general surgery to be discharged on 07/17/2022.  Patient's blood pressure a little bit on the lower side.  Hydrochlorothiazide was discontinued the other day secondary to hypokalemia.  Patient's ARB was held on the day of discharge.  Continue to hold telmisartan as outpatient.  Patient wanted to continue on her bisoprolol and Lasix.  I advised her to get a blood pressure cuff at home.  Hold off on the 2 blood pressure medications until seen by Dr. Emily Filbert.  Assessment and Plan: * SBO (small bowel obstruction) ([place]) Appreciate surgical consultation.  Patient had NG tube removed on 07/16/2022.  General surgery advance diet to soft diet.  General surgery cleared for discharge from 07/17/2022.  Essential hypertension Can continue Lasix, bisoprolol as outpatient.  Hold hydrochlorothiazide with hypokalemia.  Hold telmisartan with lower blood pressure today.  I recommended getting a blood pressure cuff for home.  Follow-up with PCP to recheck blood pressure  Leukocytosis Resolved  GERD without esophagitis on PPI therapy.  OSA on CPAP CPAP nightly.  Hypokalemia Improved with  supplementation during the hospital course.  We will continue to hold hydrochlorothiazide.         Consultants: General surgery Procedures performed: None Disposition: Home Diet recommendation:  Cardiac diet DISCHARGE MEDICATION: Allergies as of 07/17/2022       Reactions   Morphine And Related Other (See Comments)   Elephant on chest After IV push med delivered can tolerate slow delivery per pt 10/16/2018   Novocain [procaine] Other (See Comments)   Blackout (as a child)   Aspirin Other (See Comments)   Ringing in the ears   Doxycycline Hyclate Diarrhea   Elemental Sulfur Other (See Comments)   sweating   Levaquin [levofloxacin In D5w] Diarrhea   Penicillins Rash   Tape Other (See Comments)   USE PAPER TAPE        Medication List     STOP taking these medications    bisoprolol-hydrochlorothiazide 2.5-6.25 MG tablet Commonly known as: ZIAC   clindamycin 1 % external solution Commonly known as: CLEOCIN T   OVER THE COUNTER MEDICATION   OVER THE COUNTER MEDICATION   OVER THE COUNTER MEDICATION   telmisartan 40 MG tablet Commonly known as: MICARDIS       TAKE these medications    acetaminophen 650 MG CR tablet Commonly known as: TYLENOL Take 1 tablet (650 mg total) by mouth every 8 (eight) hours as needed for pain. What changed: how much to take   bisoprolol 5 MG tablet Commonly known as: ZEBETA Take 0.5 tablets (2.5 mg total) by mouth daily.   CALCIUM 600/VITAMIN D3 PO Take 1 tablet by mouth daily.   DULoxetine  60 MG capsule Commonly known as: CYMBALTA Take 60 mg by mouth daily.   esomeprazole 40 MG capsule Commonly known as: NEXIUM Take 40 mg by mouth daily.   furosemide 20 MG tablet Commonly known as: LASIX Take 40 mg by mouth daily.   metaxalone 800 MG tablet Commonly known as: SKELAXIN Take 800 mg by mouth 2 (two) times daily.        Follow-up Information     Rusty Aus, MD Follow up in 5 day(s).   Specialty: Internal  Medicine Contact information: [place] [address] [phone]                Discharge Exam: Danley Danker Weights   07/11/22 1745  Weight: 111.1 kg   Physical Exam HENT:     Head: Normocephalic.     Mouth/Throat:     Pharynx: No oropharyngeal exudate.  Eyes:     General: Lids are normal.     Conjunctiva/sclera: Conjunctivae normal.  Cardiovascular:     Rate and Rhythm: Normal rate and regular rhythm.     Heart sounds: Normal heart sounds, S1 normal and S2 normal.  Pulmonary:     Breath sounds: No decreased breath sounds, wheezing, rhonchi or rales.  Abdominal:     Palpations: Abdomen is soft.     Tenderness: There is no abdominal tenderness.  Musculoskeletal:     Right lower leg: No swelling.     Left lower leg: No swelling.  Skin:    General: Skin is warm.     Findings: No rash.  Neurological:     Mental Status: She is alert and oriented to person, place, and time.      Condition at discharge: stable  The results of significant diagnostics from this hospitalization (including imaging, microbiology, ancillary and laboratory) are listed below for reference.   Imaging Studies: DG Abd 2 Views  Result Date: 07/14/2022 CLINICAL DATA:  Small-bowel obstruction. EXAM: ABDOMEN - 2 VIEW COMPARISON:  Prior radiograph from yesterday. FINDINGS: The NG tube remains in the body region of the stomach. Persistent dilated air-filled loops of small bowel with scattered air-fluid levels. Contrast again noted in the colon which is otherwise fairly decompressed. IMPRESSION: Persistent partial small-bowel obstruction bowel gas pattern. Electronically Signed   By: Marijo Sanes M.D.   On: 07/14/2022 11:02   DG Abd Portable 1V-Small Bowel Obstruction Protocol-initial, 8 hr delay  Result Date: 07/13/2022 CLINICAL DATA:  Small bowel protocol, 8 hour delay. EXAM: PORTABLE ABDOMEN - 1 VIEW COMPARISON:  Radiograph yesterday. FINDINGS:  There is enteric contrast within the ascending, transverse, proximal descending colon. Improving gaseous small bowel distension centrally. Enteric tube remains in place. IMPRESSION: Enteric contrast throughout the colon. Improving gaseous distention of small bowel. Electronically Signed   By: Keith Rake M.D.   On: 07/13/2022 20:13   DG Chest Port 1 View  Result Date: 07/12/2022 CLINICAL DATA:  NG tube placement EXAM: PORTABLE CHEST 1 VIEW COMPARISON:  Previous studies including the examination of abdomen done earlier today FINDINGS: There is interval placement of enteric tube with its tip in the stomach. Cardiac size is in the upper limits of normal. Visualized lung fields are clear of any infiltrates or pulmonary edema. There is no pleural effusion. Apices of the lungs are not included in the image. IMPRESSION: Tip of enteric tube is seen in the stomach. Electronically Signed   By: Elmer Picker M.D.   On: 07/12/2022 16:56   IR  Naso G Tube Plc W/FL W/Rad  Result Date: 07/12/2022 CLINICAL DATA:  Small Bowel Obstruction EXAM: FLUOROSCOPIC-ASSISTED NASOGASTRIC TUBE PLACEMENT ANESTHESIA/SEDATION: Local viscous lidocaine MEDICATIONS: none CONTRAST:  None PROCEDURE: A time out was performed and viscous lidocaine was applied to the right nares. Due to resistance, placement was abandoned and approach was turned to the left nares. A small amount of viscous lidocaine was provided and a 16g Sump tube was easily passed through the left nare. Imaging demonstrated the tip of the tube passing into the esophagus. Tube was then advanced into the gastric body, and gastric contents were visualized in the tube. An image was saved as documentation. The tube was secured at 58cm at the nares. COMPLICATIONS: Not able to pass through right nares FINDINGS: NG tube side port and distal tip within the stomach Fluoroscopic dose; 2.2 mGy IMPRESSION: Successful nasogastric tube placement as described above. Read by: Alexandria Lodge, PA-C Electronically Signed   By: Michaelle Birks M.D.   On: 07/12/2022 16:11   DG Abd 2 Views  Result Date: 07/12/2022 CLINICAL DATA:  [id number] SBO (small bowel obstruction) ([place]) [id number] EXAM: ABDOMEN - 2 VIEW COMPARISON:  CT abdomen/pelvis 07/11/2022. FINDINGS: Dilated small bowel loops, compatible with known obstruction. No obvious evidence of free air. Lung bases appear largely clear. Right upper quadrant clips. Polyarticular degenerative change. IMPRESSION: Findings compatible with known small-bowel obstruction. Electronically Signed   By: Margaretha Sheffield M.D.   On: 07/12/2022 10:00   CT ABDOMEN PELVIS W CONTRAST  Result Date: 07/11/2022 CLINICAL DATA:  Diarrhea, vomiting, excessive belching EXAM: CT ABDOMEN AND PELVIS WITH CONTRAST TECHNIQUE: Multidetector CT imaging of the abdomen and pelvis was performed using the standard protocol following bolus administration of intravenous contrast. RADIATION DOSE REDUCTION: This exam was performed according to the departmental dose-optimization program which includes automated exposure control, adjustment of the mA and/or kV according to patient size and/or use of iterative reconstruction technique. CONTRAST:  146m OMNIPAQUE IOHEXOL 300 MG/ML  SOLN COMPARISON:  01/03/2013 FINDINGS: Lower chest: Lung bases are clear. Hepatobiliary: Liver is within normal limits. Status post cholecystectomy. No intrahepatic or extrahepatic duct dilatation. Pancreas: Within normal limits. Spleen: Within normal limits Adrenals/Urinary Tract: Dual glands are within normal limits. Kidneys are within normal limits.  No hydronephrosis. Bladder is within normal limits. Stomach/Bowel: Stomach is within normal limits. Dilated loops of small bowel in the left mid abdomen, with decompressed loops in the right lower quadrant, suggesting small bowel obstruction. Mildly angulated loop in the right mid/lower abdomen (series 2/image 68), relative transition, suggesting adhesions. Appendix  is not discretely visualized. Status post left hemicolectomy with suture line in the left lower pelvis (series 2/image 69). Mild left colonic diverticulosis, without evidence of diverticulitis. Vascular/Lymphatic: No evidence of abdominal aortic aneurysm. No suspicious abdominopelvic lymphadenopathy. Reproductive: Status post hysterectomy. No adnexal masses. Other: No abdominopelvic ascites. Musculoskeletal: Visualized osseous structures are within normal limits. IMPRESSION: Small bowel obstruction, with suspected adhesions in the right mid/lower abdomen. Status post left hemicolectomy. Mild left colonic diverticulosis, without evidence of diverticulitis. Additional ancillary findings as above. Electronically Signed   By: SJulian HyM.D.   On: 07/11/2022 20:42    Microbiology: Results for orders placed or performed in visit on 09/29/20  Novel Coronavirus, NAA ([facility])     Status: None   Collection Time: 09/29/20 11:02 AM   Specimen: Nasopharyngeal(NP) swabs in vial transport medium   Nasopharynge  Result Value Ref Range Status   SARS-CoV-2, NAA Not Detected Not Detected Final  Comment: This nucleic acid amplification test was developed and its performance characteristics determined by [employer]. Nucleic acid amplification tests include RT-PCR and TMA. This test has not been FDA cleared or approved. This test has been authorized by FDA under an Emergency Use Authorization (EUA). This test is only authorized for the duration of time the declaration that circumstances exist justifying the authorization of the emergency use of in vitro diagnostic tests for detection of SARS-CoV-2 virus and/or diagnosis of COVID-19 infection under section 564(b)(1) of the Act, 21 U.S.C. 267TIW-5(Y) (1), unless the authorization is terminated or revoked sooner. When diagnostic testing is negative, the possibility of a false negative result should be considered in the context of a  patient's recent exposures and the presence of clinical signs and symptoms consistent with COVID-19. An individual without symptoms of COVID-19 and who is not shedding SARS-CoV-2 virus wo uld expect to have a negative (not detected) result in this assay.   SARS-COV-2, NAA 2 DAY TAT     Status: None   Collection Time: 09/29/20 11:02 AM   Nasopharynge  Result Value Ref Range Status   SARS-CoV-2, NAA 2 DAY TAT Performed  Final    Labs: CBC: Recent Labs  Lab 07/11/22 1747 07/12/22 0649 07/13/22 0642 07/14/22 0617 07/17/22 0617  WBC 12.6* 9.9 8.1 9.7  --   HGB 15.2* 13.9 13.3 13.2 13.0  HCT 44.8 40.4 39.8 39.4  --   MCV 85.3 85.6 86.5 87.2  --   PLT 301 238 230 214  --    Basic Metabolic Panel: Recent Labs  Lab 07/13/22 0642 07/14/22 0617 07/15/22 0426 07/16/22 0349 07/17/22 0617  NA 138 140 138 141 137  K 3.8 4.3 3.3* 3.6 3.6  CL 106 105 105 106 100  CO2 '28 28 26 27 30  '$ GLUCOSE 97 97 115* 140* 119*  BUN 12 10 6* <5* 6*  CREATININE 0.93 0.83 0.78 0.80 0.85  CALCIUM 8.5* 8.5* 8.5* 9.3 9.6  MG  --   --  2.0 2.0  --    Liver Function Tests: Recent Labs  Lab 07/11/22 1747  AST 47*  ALT 62*  ALKPHOS 41  BILITOT 1.4*  PROT 7.6  ALBUMIN 3.7     Discharge time spent: greater than 30 minutes.  Signed: Loletha Grayer, MD [facility] 07/17/2022

## 2022-07-17 NOTE — Progress Notes (Signed)
Patient ID: Kelly Perry, female   DOB: June 22, 1951, 71 y.o.   MRN: [id number]     [place] Hospital Day(s): 6.   Interval History: Patient seen and examined, no acute events or new complaints overnight. Patient reports passing a lot of gas. She is feeling good eating soft food. No abdominal pain. No nausea or vomiting.   Vital signs in last 24 hours: [min-max] current  Temp:  [97.4 F (36.3 C)-98.7 F (37.1 C)] 98.3 F (36.8 C) (11/11 0723) Pulse Rate:  [70-75] 75 (11/11 0723) Resp:  [16-18] 16 (11/11 0723) BP: (92-117)/(49-57) 92/55 (11/11 0723) SpO2:  [96 %-98 %] 98 % (11/11 0723)     Height: '5\' 4"'$  (162.6 cm) Weight: 111.1 kg BMI (Calculated): 42.03   Physical Exam:  Constitutional: alert, cooperative and no distress  Respiratory: breathing non-labored at rest  Cardiovascular: regular rate and sinus rhythm  Gastrointestinal: soft, non-tender, and non-distended  Labs:     Latest Ref Rng & Units 07/17/2022    6:17 AM 07/14/2022    6:17 AM 07/13/2022    6:42 AM  CBC  WBC 4.0 - 10.5 K/uL  9.7  8.1   Hemoglobin 12.0 - 15.0 g/dL 13.0  13.2  13.3   Hematocrit 36.0 - 46.0 %  39.4  39.8   Platelets 150 - 400 K/uL  214  230       Latest Ref Rng & Units 07/17/2022    6:17 AM 07/16/2022    3:49 AM 07/15/2022    4:26 AM  CMP  Glucose 70 - 99 mg/dL 119  140  115   BUN 8 - 23 mg/dL 6  <5  6   Creatinine 0.44 - 1.00 mg/dL 0.85  0.80  0.78   Sodium 135 - 145 mmol/L 137  141  138   Potassium 3.5 - 5.1 mmol/L 3.6  3.6  3.3   Chloride 98 - 111 mmol/L 100  106  105   CO2 22 - 32 mmol/L '30  27  26   '$ Calcium 8.9 - 10.3 mg/dL 9.6  9.3  8.5     Imaging studies: No new pertinent imaging studies   Assessment/Plan:  71 y.o. female with small bowel obstruction, complicated by pertinent comorbidities including obesity, asthma, GERD, hypertension with multiple abdominal surgeries including hernia repairs and colectomy.    Patient is doing well with soft food. She  continue passing "lot of gas". Tolerated soft diet without abdominal pain, nausea or vomiting. She has had several bowel movements.   I think it is safe to discharge patient home today. We discuss to continue soft diet, hydrate well and chew meals well. Eating small meals more frequently is better than three big meals.   No surgical indication at this moment. No surgical follow up is need.   Arnold Long, MD

## 2022-07-17 NOTE — Plan of Care (Signed)

## 2022-07-17 NOTE — Plan of Care (Signed)
  Problem: Education: Goal: Knowledge of General Education information will improve Description: Including pain rating scale, medication(s)/side effects and non-pharmacologic comfort measures 07/17/2022 1414 by Evelena Peat, RN Outcome: Completed/Met 07/17/2022 1051 by Evelena Peat, RN Outcome: Progressing   Problem: Health Behavior/Discharge Planning: Goal: Ability to manage health-related needs will improve 07/17/2022 1414 by Evelena Peat, RN Outcome: Completed/Met 07/17/2022 1051 by Evelena Peat, RN Outcome: Progressing   Problem: Clinical Measurements: Goal: Ability to maintain clinical measurements within normal limits will improve 07/17/2022 1414 by Evelena Peat, RN Outcome: Completed/Met 07/17/2022 1051 by Evelena Peat, RN Outcome: Progressing Goal: Will remain free from infection 07/17/2022 1414 by Evelena Peat, RN Outcome: Completed/Met 07/17/2022 1051 by Evelena Peat, RN Outcome: Progressing Goal: Diagnostic test results will improve 07/17/2022 1414 by Evelena Peat, RN Outcome: Completed/Met 07/17/2022 1051 by Evelena Peat, RN Outcome: Progressing Goal: Respiratory complications will improve 07/17/2022 1414 by Evelena Peat, RN Outcome: Completed/Met 07/17/2022 1051 by Evelena Peat, RN Outcome: Progressing Goal: Cardiovascular complication will be avoided 07/17/2022 1414 by Evelena Peat, RN Outcome: Completed/Met 07/17/2022 1051 by Evelena Peat, RN Outcome: Progressing   Problem: Activity: Goal: Risk for activity intolerance will decrease 07/17/2022 1414 by Evelena Peat, RN Outcome: Completed/Met 07/17/2022 1051 by Evelena Peat, RN Outcome: Progressing   Problem: Nutrition: Goal: Adequate nutrition will be maintained 07/17/2022 1414 by Evelena Peat, RN Outcome: Completed/Met 07/17/2022 1051 by Evelena Peat, RN Outcome: Progressing   Problem:  Coping: Goal: Level of anxiety will decrease 07/17/2022 1414 by Evelena Peat, RN Outcome: Completed/Met 07/17/2022 1051 by Evelena Peat, RN Outcome: Progressing   Problem: Pain Managment: Goal: General experience of comfort will improve 07/17/2022 1414 by Evelena Peat, RN Outcome: Completed/Met 07/17/2022 1051 by Evelena Peat, RN Outcome: Progressing   Problem: Safety: Goal: Ability to remain free from injury will improve 07/17/2022 1414 by Evelena Peat, RN Outcome: Completed/Met 07/17/2022 1051 by Evelena Peat, RN Outcome: Progressing   Problem: Skin Integrity: Goal: Risk for impaired skin integrity will decrease 07/17/2022 1414 by Evelena Peat, RN Outcome: Completed/Met 07/17/2022 1051 by Evelena Peat, RN Outcome: Progressing

## 2022-08-10 ENCOUNTER — Other Ambulatory Visit: Payer: Self-pay | Admitting: Internal Medicine

## 2022-08-10 DIAGNOSIS — Z1231 Encounter for screening mammogram for malignant neoplasm of breast: Secondary | ICD-10-CM

## 2022-09-30 ENCOUNTER — Ambulatory Visit
Admission: RE | Admit: 2022-09-30 | Discharge: 2022-09-30 | Disposition: A | Discharge status: Home / Self Care | Payer: Medicare Other | Source: Ambulatory Visit | Attending: Internal Medicine | Admitting: Internal Medicine

## 2022-09-30 DIAGNOSIS — Z1231 Encounter for screening mammogram for malignant neoplasm of breast: Secondary | ICD-10-CM | POA: Insufficient documentation

## 2022-11-25 ENCOUNTER — Ambulatory Visit: Payer: Medicare Other | Admitting: Obstetrics and Gynecology

## 2023-01-09 NOTE — Progress Notes (Addendum)
PCP: Danella Penton, MD   Chief Complaint  Patient presents with   Annual Exam    Vaginal dryness     HPI:      [name] is a 72 y.o. [id number] whose LMP was No LMP recorded. Patient has had a hysterectomy., presents today for her annual examination.  Her menses are absent due to hyst, no PMB She does not have vasomotor sx. Hx of DVT.   Sex activity: single partner, contraception - post menopausal status. She does have occas vaginal dryness, uses lubricants with relief prn. No vag sx otherwise. Was looking at OTC Kindra vaginal dryness products but they contain estradiol (per pt, but products are no Rx). Pt with hx of DVTs.  Last Pap: 04/02/20 Results were: no abnormalities /POS HPV DNA. Repeat pap due. Pap done due to PMB bleeding/ pt though vaginal source. Pt with hx abn paps/POS HPV DNA here and there for many yrs. Had discussed colpo with Dr. Tiburcio Pea in past but not done.  Hx of STDs: ext HPV in past  Last mammogram: 09/30/22 with PCP; Results were: normal--routine follow-up in 12 months There is a FH of breast cancer in her pat aunt and 2 mat grt aunts, genetic testing not indicated for pt. There is no FH of ovarian cancer. The patient does do self-breast exams.  Colonoscopy: 8/23 at [facility] with polyps;  Repeat due after 3 years.   Tobacco use: quit years ago Alcohol use: social drinker No drug use Exercise: not active  She does get adequate calcium and Vitamin D in her diet. DEXA: 12/15 at [facility] with osteopenia in fem neck and spine, normal total femoral density.   Labs with PCP.   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 01/10/2023   Hypokalemia 07/14/2022   SBO (small bowel obstruction) (HCC) 07/11/2022   Leukocytosis 07/11/2022   Rectal polyp 06/09/2018   Mitral valve prolapse 10/31/2017   Morbid obesity with BMI of 40.0-44.9, adult (HCC) 08/03/2017   Tubular adenoma 08/16/2016   Essential  hypertension 07/16/2015   GERD without esophagitis 07/16/2015   OSA on CPAP 07/16/2015   DDD (degenerative disc disease), lumbar 02/13/2014   Lumbar radiculitis 02/13/2014    Past Surgical History:  Procedure Laterality Date   ABDOMINAL HYSTERECTOMY  2007   total   APPENDECTOMY     CERVICAL SPINE SURGERY     CHOLECYSTECTOMY     COLON SURGERY  06/30/2010   colectomy, Dr Michela Pitcher   COLONOSCOPY N/A 04/28/2022   Procedure: COLONOSCOPY;  Surgeon: Toledo, Boykin Nearing, MD;  Location: [facility] ENDOSCOPY;  Service: Gastroenterology;  Laterality: N/A;   COLONOSCOPY WITH PROPOFOL N/A 02/13/2018   Procedure: COLONOSCOPY WITH PROPOFOL;  Surgeon: Christena Deem, MD;  Location: [facility] ENDOSCOPY;  Service: Endoscopy;  Laterality: N/A;   COLOSTOMY  2011   Dr Michela Pitcher   COLOSTOMY TAKEDOWN  02/2011   Dr Weldon Picking SIGMOIDOSCOPY N/A 06/05/2018   Procedure: FLEXIBLE SIGMOIDOSCOPY;  Surgeon: Christena Deem, MD;  Location: [facility] ENDOSCOPY;  Service: Endoscopy;  Laterality: N/A;   HERNIA REPAIR     INSERTION OF MESH N/A 02/26/2013   Procedure: INSERTION OF MESH;  Surgeon: [name]. [name], MD;  Location: [facility] ORS;  Service: General;  Laterality: N/A;   IR NASO G TUBE PLC W/FL W/RAD  07/12/2022   LYSIS OF ADHESION N/A 02/26/2013   Procedure: LYSIS OF ADHESION;  Surgeon: [name].  [name], MD;  Location: [facility] ORS;  Service: General;  Laterality: N/A;   SPINE SURGERY  2010   cervical neck fusion/  LIMITED MOBILITY   TONSILLECTOMY     TRANSANAL EXCISION OF RECTAL MASS N/A 11/10/2018   Procedure: TRANSANAL EXCISION OF RECTAL POLYP;  Surgeon: Earline Mayotte, MD;  Location: [facility] ORS;  Service: General;  Laterality: N/A;   VENTRAL HERNIA REPAIR N/A 02/26/2013   Procedure: HERNIA REPAIR VENTRAL ADULT;  Surgeon: [name]. [name], MD;  Location: [facility] ORS;  Service: General;  Laterality: N/A;    Family History  Problem Relation Age of Onset   Arthritis Mother    Hypertension Mother    Cancer Maternal Aunt         ovarian or cervical   Breast cancer Maternal Aunt    Breast cancer Paternal Aunt 11   Bladder Cancer Paternal Aunt    Colon cancer Maternal Grandmother    Colon cancer Maternal Grandfather 82   Colon cancer Cousin        maternal    Social History   Socioeconomic History   Marital status: Divorced    Spouse name: Not on file   Number of children: Not on file   Years of education: Not on file   Highest education level: Not on file  Occupational History   Not on file  Tobacco Use   Smoking status: Former    Packs/day: 1.00    Years: 10.00    Additional pack years: 0.00    Total pack years: 10.00    Types: Cigarettes    Quit date: 01/16/1985    Years since quitting: 38.0   Smokeless tobacco: Never  Vaping Use   Vaping Use: Never used  Substance and Sexual Activity   Alcohol use: Yes    Comment: occ use maybe once a month   Drug use: No   Sexual activity: Yes  Other Topics Concern   Not on file  Social History Narrative   Not on file   Social Determinants of Health   Financial Resource Strain: Not on file  Food Insecurity: No Food Insecurity (07/12/2022)   Hunger Vital Sign    Worried About Running Out of Food in the Last Year: Never true    Ran Out of Food in the Last Year: Never true  Transportation Needs: No Transportation Needs (07/12/2022)   PRAPARE - Administrator, Civil Service (Medical): No    Lack of Transportation (Non-Medical): No  Physical Activity: Not on file  Stress: Not on file  Social Connections: Not on file  Intimate Partner Violence: Not At Risk (07/12/2022)   Humiliation, Afraid, Rape, and Kick questionnaire    Fear of Current or Ex-Partner: No    Emotionally Abused: No    Physically Abused: No    Sexually Abused: No     Current Outpatient Medications:    acetaminophen (TYLENOL) 650 MG CR tablet, Take 1 tablet (650 mg total) by mouth every 8 (eight) hours as needed for pain., Disp: , Rfl:    bisoprolol (ZEBETA) 5 MG tablet,  Take 0.5 tablets (2.5 mg total) by mouth daily., Disp: 15 tablet, Rfl: 0   DULoxetine (CYMBALTA) 60 MG capsule, Take 60 mg by mouth daily. , Disp: , Rfl:    esomeprazole (NEXIUM) 40 MG capsule, Take 40 mg by mouth daily., Disp: , Rfl:    furosemide (LASIX) 20 MG tablet, Take 40 mg by mouth daily. , Disp: , Rfl:  metFORMIN (GLUCOPHAGE) 500 MG tablet, Take 500 mg by mouth every morning., Disp: , Rfl:    OZEMPIC, 0.25 OR 0.5 MG/DOSE, 2 MG/3ML SOPN, SMARTSIG:0.375 Milliliter(s) SUB-Q Once a Week, Disp: , Rfl:    potassium chloride (KLOR-CON) 10 MEQ tablet, Take by mouth., Disp: , Rfl:    telmisartan (MICARDIS) 40 MG tablet, Take by mouth., Disp: , Rfl:    tiZANidine (ZANAFLEX) 4 MG tablet, Take 4 mg by mouth 3 (three) times daily as needed., Disp: , Rfl:    Calcium Carb-Cholecalciferol (CALCIUM 600/VITAMIN D3 PO), Take 1 tablet by mouth daily. (Patient not taking: Reported on 01/10/2023), Disp: , Rfl:    metaxalone (SKELAXIN) 800 MG tablet, Take 800 mg by mouth 2 (two) times daily.  (Patient not taking: Reported on 01/10/2023), Disp: , Rfl:      ROS:  Review of Systems  Constitutional:  Negative for fatigue, fever and unexpected weight change.  Respiratory:  Negative for cough, shortness of breath and wheezing.   Cardiovascular:  Negative for chest pain, palpitations and leg swelling.  Gastrointestinal:  Negative for blood in stool, constipation, diarrhea, nausea and vomiting.  Endocrine: Negative for cold intolerance, heat intolerance and polyuria.  Genitourinary:  Negative for dyspareunia, dysuria, flank pain, frequency, genital sores, hematuria, menstrual problem, pelvic pain, urgency, vaginal bleeding, vaginal discharge and vaginal pain.  Musculoskeletal:  Negative for back pain, joint swelling and myalgias.  Skin:  Negative for rash.  Neurological:  Negative for dizziness, syncope, light-headedness, numbness and headaches.  Hematological:  Negative for adenopathy.  Psychiatric/Behavioral:   Negative for agitation, confusion, sleep disturbance and suicidal ideas. The patient is not nervous/anxious.    BREAST: No symptoms    Objective: BP 118/80   Ht 5\' 4"  (1.626 m)   Wt 240 lb (108.9 kg)   BMI 41.20 kg/m    Physical Exam Constitutional:      Appearance: She is well-developed.  Genitourinary:     Vulva normal.     Genitourinary Comments: UTERUS/CX SURG REM     Right Labia: No rash, tenderness or lesions.    Left Labia: No tenderness, lesions or rash.    Vaginal cuff intact.    No vaginal discharge, erythema or tenderness.     No vaginal atrophy present.     Right Adnexa: not tender and no mass present.    Left Adnexa: not tender and no mass present.    Cervix is absent.     Uterus is absent.  Breasts:    Right: No mass, nipple discharge, skin change or tenderness.     Left: No mass, nipple discharge, skin change or tenderness.  Neck:     Thyroid: No thyromegaly.  Cardiovascular:     Rate and Rhythm: Normal rate and regular rhythm.     Heart sounds: Normal heart sounds. No murmur heard. Pulmonary:     Effort: Pulmonary effort is normal.     Breath sounds: Normal breath sounds.  Abdominal:     Palpations: Abdomen is soft.     Tenderness: There is no abdominal tenderness. There is no guarding.  Musculoskeletal:        General: Normal range of motion.     Cervical back: Normal range of motion.  Neurological:     General: No focal deficit present.     Mental Status: She is alert and oriented to person, place, and time.     Cranial Nerves: No cranial nerve deficit.  Skin:    General: Skin is warm and  dry.  Psychiatric:        Mood and Affect: Mood normal.        Behavior: Behavior normal.        Thought Content: Thought content normal.        Judgment: Judgment normal.  Vitals reviewed.     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 Given hx of DVT and no GSM sx, don't recommend vag ERT. Can do hyularonic acid vag supp prn or lubricants as needed.           GYN counsel breast self exam, mammography screening, menopause, adequate intake of calcium and vitamin D, diet and exercise    F/U  Return in about 1 year (around 01/10/2024).  Jim Lundin B. Lehman Whiteley, PA-C 01/10/2023 4:04 PM

## 2023-01-10 ENCOUNTER — Ambulatory Visit (INDEPENDENT_AMBULATORY_CARE_PROVIDER_SITE_OTHER): Payer: Medicare Other | Admitting: Obstetrics and Gynecology

## 2023-01-10 ENCOUNTER — Encounter: Payer: Self-pay | Admitting: Obstetrics and Gynecology

## 2023-01-10 ENCOUNTER — Other Ambulatory Visit (HOSPITAL_COMMUNITY)
Admission: RE | Admit: 2023-01-10 | Discharge: 2023-01-10 | Disposition: A | Discharge status: Home / Self Care | Payer: Medicare Other | Source: Ambulatory Visit | Attending: Obstetrics and Gynecology | Admitting: Obstetrics and Gynecology

## 2023-01-10 VITALS — BP 118/80 | Ht 64.0 in | Wt 240.0 lb

## 2023-01-10 DIAGNOSIS — R87811 Vaginal high risk human papillomavirus (HPV) DNA test positive: Secondary | ICD-10-CM

## 2023-01-10 DIAGNOSIS — E2839 Other primary ovarian failure: Secondary | ICD-10-CM

## 2023-01-10 DIAGNOSIS — Z1211 Encounter for screening for malignant neoplasm of colon: Secondary | ICD-10-CM

## 2023-01-10 DIAGNOSIS — Z01411 Encounter for gynecological examination (general) (routine) with abnormal findings: Secondary | ICD-10-CM | POA: Diagnosis not present

## 2023-01-10 DIAGNOSIS — Z01419 Encounter for gynecological examination (general) (routine) without abnormal findings: Secondary | ICD-10-CM | POA: Diagnosis not present

## 2023-01-10 DIAGNOSIS — Z1231 Encounter for screening mammogram for malignant neoplasm of breast: Secondary | ICD-10-CM

## 2023-01-10 DIAGNOSIS — Z1272 Encounter for screening for malignant neoplasm of vagina: Secondary | ICD-10-CM

## 2023-01-10 DIAGNOSIS — Z1151 Encounter for screening for human papillomavirus (HPV): Secondary | ICD-10-CM

## 2023-01-10 DIAGNOSIS — Z13820 Encounter for screening for osteoporosis: Secondary | ICD-10-CM

## 2023-01-10 NOTE — Patient Instructions (Signed)
I value your feedback and you entrusting us with your care. If you get a [facility] patient survey, I would appreciate you taking the time to let us know about your experience today. Thank you! ? ? ?

## 2023-01-13 LAB — CYTOLOGY - PAP
Adequacy: ABSENT
Comment: NEGATIVE
Diagnosis: UNDETERMINED — AB
High risk HPV: POSITIVE — AB

## 2023-01-18 NOTE — Progress Notes (Signed)
Pls call pt to schedule colpo with MD. She needs late PM appt

## 2023-01-19 ENCOUNTER — Telehealth: Payer: Self-pay | Admitting: Obstetrics and Gynecology

## 2023-01-19 NOTE — Telephone Encounter (Signed)
That's fine

## 2023-01-19 NOTE — Telephone Encounter (Signed)
-----   Message from Rica Records, PA-C sent at 01/18/2023  4:39 PM EDT ----- Pls call pt to schedule colpo with MD. She needs late PM appt

## 2023-01-19 NOTE — Telephone Encounter (Signed)
I contacted the patient  via phone. I advised our first opening is on 7/16 with Dr. Valentino Saxon. Kelly Perry says her family has vacation planned and that day wouldn't work for her. Next available to offer was 8/20 with Dr. Valentino Saxon. The patient states that date/time works best for her.

## 2023-02-26 ENCOUNTER — Ambulatory Visit: Payer: Commercial Indemnity

## 2023-03-22 ENCOUNTER — Ambulatory Visit: Payer: Medicare Other | Admitting: Obstetrics and Gynecology

## 2023-04-26 ENCOUNTER — Ambulatory Visit: Payer: Medicare Other | Admitting: Obstetrics and Gynecology

## 2023-05-24 ENCOUNTER — Encounter: Payer: Self-pay | Admitting: Obstetrics & Gynecology

## 2023-05-24 ENCOUNTER — Ambulatory Visit (INDEPENDENT_AMBULATORY_CARE_PROVIDER_SITE_OTHER): Payer: Medicare Other | Admitting: Obstetrics & Gynecology

## 2023-05-24 ENCOUNTER — Other Ambulatory Visit (HOSPITAL_COMMUNITY)
Admission: RE | Admit: 2023-05-24 | Discharge: 2023-05-24 | Disposition: A | Discharge status: Home / Self Care | Payer: Medicare Other | Source: Ambulatory Visit | Attending: Obstetrics and Gynecology | Admitting: Obstetrics and Gynecology

## 2023-05-24 VITALS — BP 110/60 | Ht 64.0 in

## 2023-05-24 DIAGNOSIS — R87810 Cervical high risk human papillomavirus (HPV) DNA test positive: Secondary | ICD-10-CM

## 2023-05-24 DIAGNOSIS — R87610 Atypical squamous cells of undetermined significance on cytologic smear of cervix (ASC-US): Secondary | ICD-10-CM | POA: Insufficient documentation

## 2023-05-24 DIAGNOSIS — N952 Postmenopausal atrophic vaginitis: Secondary | ICD-10-CM

## 2023-05-24 MED ORDER — ESTROGENS CONJUGATED 0.625 MG/GM VA CREA: TOPICAL_CREAM | VAGINAL | 12 refills | Status: AC

## 2023-05-24 NOTE — Progress Notes (Signed)
GYNECOLOGY PROGRESS NOTE  Subjective:    Patient ID: Jon Billings, female    DOB: 12-10-1950, 72 y.o.   MRN: [id number]  HPI  Patient is a 72 y.o. divorced P3 who presents for a colpo due to ASCUS + HR HPV of her vaginal cuff 04/2022. She has known HPV for decades. She had a TAH/BSO in 2007. She is occasionally sexually active.  The following portions of the patient's history were reviewed and updated as appropriate: allergies, current medications, past family history, past medical history, past social history, past surgical history, and problem list.  Review of Systems Pertinent items are noted in HPI.   Objective:   Blood pressure 110/60, height 5\' 4"  (1.626 m). Body mass index is 41.2 kg/m. Well nourished, well hydrated White female, no apparent distress She is ambulating and conversing normally. Consent signed, time out done Graves speculum used. Acetic acid swabs placed in the vagina for 2 minutes prior to colposcopic exam. There was a 1 cm area at the 6 o'clock position (cuff at the center) with some changes c/w VAIN 1. I biopsied this area and used silver nitrate to achieve hemostasis. She tolerated the procedure well.   Assessment:   1. ASCUS with positive high risk HPV cervical      Plan:   1. ASCUS with positive high risk HPV cervical - I prescribed vaginal estrogen to be used 2 nights per week. - I will message her next week with pathology results and management plan.

## 2023-05-26 LAB — SURGICAL PATHOLOGY

## 2023-05-30 ENCOUNTER — Encounter: Payer: Self-pay | Admitting: Obstetrics & Gynecology

## 2023-08-25 ENCOUNTER — Ambulatory Visit: Payer: Medicare Other | Admitting: Podiatry

## 2023-08-25 ENCOUNTER — Encounter: Payer: Self-pay | Admitting: Podiatry

## 2023-08-25 VITALS — Ht 64.0 in | Wt 240.0 lb

## 2023-08-25 DIAGNOSIS — M76822 Posterior tibial tendinitis, left leg: Secondary | ICD-10-CM

## 2023-08-25 MED ORDER — METHYLPREDNISOLONE 4 MG PO TBPK: ORAL_TABLET | ORAL | 0 refills | Status: AC

## 2023-08-25 NOTE — Progress Notes (Signed)
Subjective:  Patient ID: Kelly Perry, female    DOB: Nov 07, 1950,  MRN: [id number]  Chief Complaint  Patient presents with   Foot Pain    Pt is here due left ankle and foot     72 y.o. female presents with the above complaint.  Patient presents with complaint of left posterior tibial tendinitis that is going on for quite some time.  Patient states medial foot pain has been going for a month.  Hurts with ambulation is with pressure pain scale 7 out of 10 dull aching nature she has not tried anything for it she would like to discuss treatment options for it.  She stands a lot on her foot.   Review of Systems: Negative except as noted in the HPI. Denies N/V/F/Ch.  Past Medical History:  Diagnosis Date   Arthritis    Asthma    wheezing with URI- no inhalers day to day   Cervical disc herniation    Colon adenomas    Deep vein thrombosis (DVT) during current hospitalization (HCC)    Diabetes mellitus without complication (HCC)    Easy bruising    GERD (gastroesophageal reflux disease)    Hypertension    Mitral valve prolapse    eccho 7/13 chart   Peripheral vascular disease (HCC) 03/2011   DVT  left leg  post op- coumadin x 6 months   Sleep apnea     Current Outpatient Medications:    acetaminophen (TYLENOL) 650 MG CR tablet, Take 1 tablet (650 mg total) by mouth every 8 (eight) hours as needed for pain., Disp: , Rfl:    bisoprolol (ZEBETA) 5 MG tablet, Take 0.5 tablets (2.5 mg total) by mouth daily., Disp: 15 tablet, Rfl: 0   bisoprolol-hydrochlorothiazide (ZIAC) 2.5-6.25 MG tablet, Take 1 tablet by mouth daily., Disp: , Rfl:    Calcium Carb-Cholecalciferol (CALCIUM 600/VITAMIN D3 PO), Take 1 tablet by mouth daily., Disp: , Rfl:    conjugated estrogens (PREMARIN) vaginal cream, 1 gram per vagina 2 nights per week, Disp: 42.5 g, Rfl: 12   DULoxetine (CYMBALTA) 60 MG capsule, Take 60 mg by mouth daily. , Disp: , Rfl:    esomeprazole (NEXIUM) 40 MG capsule, Take 40 mg by mouth  daily., Disp: , Rfl:    furosemide (LASIX) 20 MG tablet, Take 40 mg by mouth daily. , Disp: , Rfl:    metaxalone (SKELAXIN) 800 MG tablet, Take 800 mg by mouth 2 (two) times daily., Disp: , Rfl:    metFORMIN (GLUCOPHAGE) 500 MG tablet, Take 500 mg by mouth every morning., Disp: , Rfl:    methylPREDNISolone (MEDROL DOSEPAK) 4 MG TBPK tablet, Take as directed, Disp: 21 each, Rfl: 0   OZEMPIC, 0.25 OR 0.5 MG/DOSE, 2 MG/3ML SOPN, SMARTSIG:0.375 Milliliter(s) SUB-Q Once a Week, Disp: , Rfl:    potassium chloride (KLOR-CON) 10 MEQ tablet, Take by mouth., Disp: , Rfl:    telmisartan (MICARDIS) 40 MG tablet, Take by mouth., Disp: , Rfl:    tiZANidine (ZANAFLEX) 4 MG tablet, Take 4 mg by mouth 3 (three) times daily as needed., Disp: , Rfl:   Social History   Tobacco Use  Smoking Status Former   Current packs/day: 0.00   Average packs/day: 1 pack/day for 10.0 years (10.0 ttl pk-yrs)   Types: Cigarettes   Start date: 01/17/1975   Quit date: 01/16/1985   Years since quitting: 38.6  Smokeless Tobacco Never    Allergies  Allergen Reactions   Morphine And Codeine Other (See Comments)  Elephant on chest After IV push med delivered can tolerate slow delivery per pt 10/16/2018   Novocain [Procaine] Other (See Comments)    Blackout (as a child)   Aspirin Other (See Comments)    Ringing in the ears   Doxycycline Hyclate Diarrhea   Elemental Sulfur Other (See Comments)    sweating   Levaquin [Levofloxacin In D5w] Diarrhea   Penicillins Rash   Tape Other (See Comments)    USE PAPER TAPE   Objective:  There were no vitals filed for this visit. Body mass index is 41.2 kg/m. Constitutional Well developed. Well nourished.  Vascular Dorsalis pedis pulses palpable bilaterally. Posterior tibial pulses palpable bilaterally. Capillary refill normal to all digits.  No cyanosis or clubbing noted. Pedal hair growth normal.  Neurologic Normal speech. Oriented to person, place, and time. Epicritic  sensation to light touch grossly present bilaterally.  Dermatologic Nails well groomed and normal in appearance. No open wounds. No skin lesions.  Orthopedic: Pain on palpation left posterior tibial tendon and along the course of the tendon mild pain at the insertion.  Pain with resisted inversion/plantarflexion of the foot.  No pain with dorsiflexion eversion of the foot.  No pain at the peroneal tendon ATFL ligament Achilles tendon.   Radiographs: None Assessment:   1. Posterior tibial tendinitis, left    Plan:  Patient was evaluated and treated and all questions answered.  Left posterior tibial tendinitis -All questions and concerns were discussed with the patient extensively given the amount of pain that she is having she will benefit from cam boot immobilization of the risk of rupture, residual pain.  Patient agrees with plan to proceed with cam boot immobilization.  She will also benefit from steroid injection in the follow-up.  Patient would like to undergo steroid and I discussed musculature associated with it. -A steroid injection was performed at left medial foot pain using 1% plain Lidocaine and 10 mg of Kenalog. This was well tolerated.   No follow-ups on file.

## 2023-09-22 ENCOUNTER — Ambulatory Visit: Payer: Medicare Other | Admitting: Podiatry

## 2024-01-02 ENCOUNTER — Other Ambulatory Visit: Payer: Self-pay | Admitting: Internal Medicine

## 2024-01-02 ENCOUNTER — Other Ambulatory Visit: Payer: Self-pay | Admitting: Obstetrics and Gynecology

## 2024-01-02 DIAGNOSIS — E2839 Other primary ovarian failure: Secondary | ICD-10-CM

## 2024-01-02 DIAGNOSIS — Z1231 Encounter for screening mammogram for malignant neoplasm of breast: Secondary | ICD-10-CM

## 2024-01-02 DIAGNOSIS — Z13820 Encounter for screening for osteoporosis: Secondary | ICD-10-CM

## 2024-01-10 ENCOUNTER — Ambulatory Visit
Admission: RE | Admit: 2024-01-10 | Discharge: 2024-01-10 | Disposition: A | Discharge status: Home / Self Care | Source: Ambulatory Visit | Attending: Internal Medicine | Admitting: Internal Medicine

## 2024-01-10 DIAGNOSIS — Z1231 Encounter for screening mammogram for malignant neoplasm of breast: Secondary | ICD-10-CM | POA: Diagnosis present

## 2024-07-06 ENCOUNTER — Other Ambulatory Visit: Payer: Self-pay | Admitting: Obstetrics & Gynecology
